# Patient Record
Sex: Female | Born: 1960 | Race: Black or African American | Hispanic: No | Marital: Married | Smoking: Never smoker
Health system: Southern US, Community
[De-identification: ages and names within clinical notes are randomized; demographics above are authoritative.]

## PROBLEM LIST (undated history)

## (undated) DIAGNOSIS — Z9221 Personal history of antineoplastic chemotherapy: Secondary | ICD-10-CM

## (undated) DIAGNOSIS — I10 Essential (primary) hypertension: Secondary | ICD-10-CM

## (undated) DIAGNOSIS — Z923 Personal history of irradiation: Secondary | ICD-10-CM

## (undated) DIAGNOSIS — C50919 Malignant neoplasm of unspecified site of unspecified female breast: Secondary | ICD-10-CM

## (undated) HISTORY — PX: TUBAL LIGATION: SHX77

## (undated) HISTORY — DX: Malignant neoplasm of unspecified site of unspecified female breast: C50.919

## (undated) HISTORY — DX: Essential (primary) hypertension: I10

---

## 1998-04-11 ENCOUNTER — Other Ambulatory Visit: Admission: RE | Admit: 1998-04-11 | Discharge: 1998-04-11 | Payer: Self-pay | Admitting: Obstetrics and Gynecology

## 1999-01-31 ENCOUNTER — Ambulatory Visit (HOSPITAL_COMMUNITY): Admission: RE | Admit: 1999-01-31 | Discharge: 1999-01-31 | Payer: Self-pay | Admitting: Obstetrics and Gynecology

## 1999-01-31 ENCOUNTER — Encounter: Payer: Self-pay | Admitting: Obstetrics and Gynecology

## 1999-03-27 ENCOUNTER — Other Ambulatory Visit: Admission: RE | Admit: 1999-03-27 | Discharge: 1999-03-27 | Payer: Self-pay | Admitting: *Deleted

## 1999-11-06 ENCOUNTER — Encounter: Admission: RE | Admit: 1999-11-06 | Discharge: 2000-02-04 | Payer: Self-pay | Admitting: Family Medicine

## 2000-02-20 ENCOUNTER — Ambulatory Visit (HOSPITAL_COMMUNITY): Admission: RE | Admit: 2000-02-20 | Discharge: 2000-02-20 | Payer: Self-pay | Admitting: Family Medicine

## 2000-02-20 ENCOUNTER — Encounter: Payer: Self-pay | Admitting: Family Medicine

## 2000-02-26 ENCOUNTER — Encounter: Admission: RE | Admit: 2000-02-26 | Discharge: 2000-02-26 | Payer: Self-pay | Admitting: Family Medicine

## 2000-02-26 ENCOUNTER — Encounter: Payer: Self-pay | Admitting: Family Medicine

## 2000-04-22 ENCOUNTER — Other Ambulatory Visit: Admission: RE | Admit: 2000-04-22 | Discharge: 2000-04-22 | Payer: Self-pay | Admitting: Obstetrics and Gynecology

## 2001-01-31 ENCOUNTER — Emergency Department (HOSPITAL_COMMUNITY): Admission: EM | Admit: 2001-01-31 | Discharge: 2001-01-31 | Payer: Self-pay

## 2001-01-31 ENCOUNTER — Encounter: Payer: Self-pay | Admitting: Emergency Medicine

## 2001-02-07 ENCOUNTER — Encounter: Payer: Self-pay | Admitting: Family Medicine

## 2001-02-07 ENCOUNTER — Encounter: Admission: RE | Admit: 2001-02-07 | Discharge: 2001-02-07 | Payer: Self-pay | Admitting: Family Medicine

## 2001-02-11 ENCOUNTER — Encounter: Admission: RE | Admit: 2001-02-11 | Discharge: 2001-03-09 | Payer: Self-pay | Admitting: Family Medicine

## 2001-02-27 ENCOUNTER — Encounter: Admission: RE | Admit: 2001-02-27 | Discharge: 2001-02-27 | Payer: Self-pay | Admitting: Obstetrics and Gynecology

## 2001-02-27 ENCOUNTER — Encounter: Payer: Self-pay | Admitting: Obstetrics and Gynecology

## 2001-05-25 ENCOUNTER — Other Ambulatory Visit: Admission: RE | Admit: 2001-05-25 | Discharge: 2001-05-25 | Payer: Self-pay | Admitting: Obstetrics and Gynecology

## 2002-03-15 ENCOUNTER — Encounter: Payer: Self-pay | Admitting: Obstetrics and Gynecology

## 2002-03-15 ENCOUNTER — Ambulatory Visit (HOSPITAL_COMMUNITY): Admission: RE | Admit: 2002-03-15 | Discharge: 2002-03-15 | Payer: Self-pay | Admitting: Obstetrics and Gynecology

## 2002-06-16 ENCOUNTER — Other Ambulatory Visit: Admission: RE | Admit: 2002-06-16 | Discharge: 2002-06-16 | Payer: Self-pay | Admitting: Obstetrics and Gynecology

## 2003-03-18 ENCOUNTER — Ambulatory Visit (HOSPITAL_COMMUNITY): Admission: RE | Admit: 2003-03-18 | Discharge: 2003-03-18 | Payer: Self-pay | Admitting: Obstetrics and Gynecology

## 2003-05-14 HISTORY — PX: BREAST LUMPECTOMY: SHX2

## 2003-06-27 ENCOUNTER — Other Ambulatory Visit: Admission: RE | Admit: 2003-06-27 | Discharge: 2003-06-27 | Payer: Self-pay | Admitting: Obstetrics and Gynecology

## 2004-01-23 ENCOUNTER — Encounter (INDEPENDENT_AMBULATORY_CARE_PROVIDER_SITE_OTHER): Payer: Self-pay | Admitting: Diagnostic Radiology

## 2004-01-23 ENCOUNTER — Encounter (INDEPENDENT_AMBULATORY_CARE_PROVIDER_SITE_OTHER): Payer: Self-pay | Admitting: *Deleted

## 2004-01-23 ENCOUNTER — Encounter: Admission: RE | Admit: 2004-01-23 | Discharge: 2004-01-23 | Payer: Self-pay | Admitting: Obstetrics and Gynecology

## 2004-01-24 ENCOUNTER — Encounter: Admission: RE | Admit: 2004-01-24 | Discharge: 2004-01-24 | Payer: Self-pay | Admitting: Obstetrics and Gynecology

## 2004-01-27 ENCOUNTER — Ambulatory Visit (HOSPITAL_COMMUNITY): Admission: RE | Admit: 2004-01-27 | Discharge: 2004-01-27 | Payer: Self-pay | Admitting: General Surgery

## 2004-02-08 ENCOUNTER — Ambulatory Visit (HOSPITAL_COMMUNITY): Admission: RE | Admit: 2004-02-08 | Discharge: 2004-02-09 | Payer: Self-pay | Admitting: General Surgery

## 2004-02-08 ENCOUNTER — Encounter (INDEPENDENT_AMBULATORY_CARE_PROVIDER_SITE_OTHER): Payer: Self-pay | Admitting: *Deleted

## 2004-02-08 ENCOUNTER — Encounter (INDEPENDENT_AMBULATORY_CARE_PROVIDER_SITE_OTHER): Payer: Self-pay | Admitting: General Surgery

## 2004-02-29 ENCOUNTER — Encounter (INDEPENDENT_AMBULATORY_CARE_PROVIDER_SITE_OTHER): Payer: Self-pay | Admitting: *Deleted

## 2004-02-29 ENCOUNTER — Ambulatory Visit (HOSPITAL_COMMUNITY): Admission: RE | Admit: 2004-02-29 | Discharge: 2004-02-29 | Payer: Self-pay | Admitting: General Surgery

## 2004-03-09 ENCOUNTER — Ambulatory Visit (HOSPITAL_COMMUNITY): Admission: RE | Admit: 2004-03-09 | Discharge: 2004-03-09 | Payer: Self-pay | Admitting: Oncology

## 2004-03-12 ENCOUNTER — Ambulatory Visit (HOSPITAL_COMMUNITY): Admission: RE | Admit: 2004-03-12 | Discharge: 2004-03-12 | Payer: Self-pay | Admitting: Oncology

## 2004-03-16 ENCOUNTER — Ambulatory Visit (HOSPITAL_COMMUNITY): Admission: RE | Admit: 2004-03-16 | Discharge: 2004-03-16 | Payer: Self-pay | Admitting: Oncology

## 2004-03-19 ENCOUNTER — Ambulatory Visit: Payer: Self-pay | Admitting: Oncology

## 2004-05-04 ENCOUNTER — Ambulatory Visit: Payer: Self-pay | Admitting: Oncology

## 2004-06-01 ENCOUNTER — Encounter: Admission: RE | Admit: 2004-06-01 | Discharge: 2004-06-01 | Payer: Self-pay | Admitting: Oncology

## 2004-06-25 ENCOUNTER — Ambulatory Visit: Payer: Self-pay | Admitting: Oncology

## 2004-07-16 ENCOUNTER — Ambulatory Visit: Admission: RE | Admit: 2004-07-16 | Discharge: 2004-09-28 | Payer: Self-pay | Admitting: Radiation Oncology

## 2004-07-23 ENCOUNTER — Encounter: Admission: RE | Admit: 2004-07-23 | Discharge: 2004-07-23 | Payer: Self-pay | Admitting: *Deleted

## 2004-08-10 ENCOUNTER — Ambulatory Visit: Payer: Self-pay | Admitting: Oncology

## 2004-10-23 ENCOUNTER — Ambulatory Visit: Payer: Self-pay | Admitting: Oncology

## 2004-11-02 ENCOUNTER — Ambulatory Visit (HOSPITAL_BASED_OUTPATIENT_CLINIC_OR_DEPARTMENT_OTHER): Admission: RE | Admit: 2004-11-02 | Discharge: 2004-11-02 | Payer: Self-pay | Admitting: General Surgery

## 2005-01-22 ENCOUNTER — Ambulatory Visit: Payer: Self-pay | Admitting: Oncology

## 2005-01-25 ENCOUNTER — Encounter: Admission: RE | Admit: 2005-01-25 | Discharge: 2005-01-25 | Payer: Self-pay | Admitting: General Surgery

## 2005-04-29 ENCOUNTER — Ambulatory Visit: Payer: Self-pay | Admitting: Oncology

## 2005-04-30 ENCOUNTER — Ambulatory Visit (HOSPITAL_COMMUNITY): Admission: RE | Admit: 2005-04-30 | Discharge: 2005-04-30 | Payer: Self-pay | Admitting: Oncology

## 2005-09-08 ENCOUNTER — Ambulatory Visit: Payer: Self-pay | Admitting: Oncology

## 2005-09-10 LAB — COMPREHENSIVE METABOLIC PANEL
BUN: 16 mg/dL (ref 6–23)
CO2: 27 mEq/L (ref 19–32)
Creatinine, Ser: 1.2 mg/dL (ref 0.4–1.2)
Glucose, Bld: 83 mg/dL (ref 70–99)
Total Bilirubin: 0.4 mg/dL (ref 0.3–1.2)

## 2005-09-10 LAB — CBC WITH DIFFERENTIAL/PLATELET
Basophils Absolute: 0 10*3/uL (ref 0.0–0.1)
Eosinophils Absolute: 0 10*3/uL (ref 0.0–0.5)
LYMPH%: 41.1 % (ref 14.0–48.0)
MCV: 93.7 fL (ref 81.0–101.0)
MONO%: 8.1 % (ref 0.0–13.0)
NEUT#: 2 10*3/uL (ref 1.5–6.5)
Platelets: 276 10*3/uL (ref 145–400)
RBC: 4.06 10*6/uL (ref 3.70–5.32)

## 2005-09-10 LAB — LACTATE DEHYDROGENASE: LDH: 170 U/L (ref 94–250)

## 2005-09-10 LAB — CANCER ANTIGEN 27.29: CA 27.29: 11 U/mL (ref 0–39)

## 2005-09-16 ENCOUNTER — Ambulatory Visit (HOSPITAL_COMMUNITY): Admission: RE | Admit: 2005-09-16 | Discharge: 2005-09-16 | Payer: Self-pay | Admitting: Oncology

## 2005-09-20 ENCOUNTER — Ambulatory Visit (HOSPITAL_COMMUNITY): Admission: RE | Admit: 2005-09-20 | Discharge: 2005-09-20 | Payer: Self-pay | Admitting: Oncology

## 2006-01-23 ENCOUNTER — Ambulatory Visit: Payer: Self-pay | Admitting: Oncology

## 2006-01-27 ENCOUNTER — Encounter: Admission: RE | Admit: 2006-01-27 | Discharge: 2006-01-27 | Payer: Self-pay | Admitting: Oncology

## 2006-01-27 ENCOUNTER — Ambulatory Visit (HOSPITAL_COMMUNITY): Admission: RE | Admit: 2006-01-27 | Discharge: 2006-01-27 | Payer: Self-pay | Admitting: Oncology

## 2006-01-27 LAB — CBC WITH DIFFERENTIAL/PLATELET
EOS%: 0.8 % (ref 0.0–7.0)
LYMPH%: 34.3 % (ref 14.0–48.0)
MCH: 32.2 pg (ref 26.0–34.0)
MCHC: 34.1 g/dL (ref 32.0–36.0)
MCV: 94.4 fL (ref 81.0–101.0)
MONO%: 9.7 % (ref 0.0–13.0)
Platelets: 286 10*3/uL (ref 145–400)
RBC: 4.18 10*6/uL (ref 3.70–5.32)
RDW: 12.4 % (ref 11.3–14.5)

## 2006-01-27 LAB — COMPREHENSIVE METABOLIC PANEL
ALT: 18 U/L (ref 0–40)
Alkaline Phosphatase: 87 U/L (ref 39–117)
Creatinine, Ser: 1.1 mg/dL (ref 0.40–1.20)
Sodium: 141 mEq/L (ref 135–145)
Total Bilirubin: 0.4 mg/dL (ref 0.3–1.2)
Total Protein: 7.9 g/dL (ref 6.0–8.3)

## 2006-01-27 LAB — TECHNOLOGIST REVIEW: Technologist Review: 11

## 2006-04-23 ENCOUNTER — Ambulatory Visit: Payer: Self-pay | Admitting: Oncology

## 2006-04-25 LAB — COMPREHENSIVE METABOLIC PANEL
Albumin: 4.1 g/dL (ref 3.5–5.2)
CO2: 25 mEq/L (ref 19–32)
Calcium: 9.4 mg/dL (ref 8.4–10.5)
Chloride: 102 mEq/L (ref 96–112)
Glucose, Bld: 88 mg/dL (ref 70–99)
Potassium: 3.5 mEq/L (ref 3.5–5.3)
Sodium: 138 mEq/L (ref 135–145)
Total Bilirubin: 0.4 mg/dL (ref 0.3–1.2)
Total Protein: 7.3 g/dL (ref 6.0–8.3)

## 2006-04-25 LAB — CBC WITH DIFFERENTIAL/PLATELET
Eosinophils Absolute: 0 10*3/uL (ref 0.0–0.5)
LYMPH%: 45 % (ref 14.0–48.0)
MONO#: 0.3 10*3/uL (ref 0.1–0.9)
NEUT#: 1.8 10*3/uL (ref 1.5–6.5)
Platelets: 255 10*3/uL (ref 145–400)
RBC: 3.99 10*6/uL (ref 3.70–5.32)
WBC: 3.9 10*3/uL (ref 3.9–10.0)
lymph#: 1.8 10*3/uL (ref 0.9–3.3)

## 2006-04-25 LAB — LACTATE DEHYDROGENASE: LDH: 171 U/L (ref 94–250)

## 2006-04-29 ENCOUNTER — Ambulatory Visit (HOSPITAL_COMMUNITY): Admission: RE | Admit: 2006-04-29 | Discharge: 2006-04-29 | Payer: Self-pay | Admitting: Oncology

## 2006-08-11 ENCOUNTER — Encounter: Admission: RE | Admit: 2006-08-11 | Discharge: 2006-08-11 | Payer: Self-pay | Admitting: Family Medicine

## 2006-08-19 ENCOUNTER — Ambulatory Visit: Payer: Self-pay | Admitting: Oncology

## 2006-08-22 LAB — COMPREHENSIVE METABOLIC PANEL
ALT: 8 U/L (ref 0–35)
Albumin: 3.9 g/dL (ref 3.5–5.2)
Alkaline Phosphatase: 75 U/L (ref 39–117)
CO2: 26 mEq/L (ref 19–32)
Glucose, Bld: 84 mg/dL (ref 70–99)
Potassium: 3.6 mEq/L (ref 3.5–5.3)
Sodium: 138 mEq/L (ref 135–145)
Total Bilirubin: 0.4 mg/dL (ref 0.3–1.2)
Total Protein: 7.6 g/dL (ref 6.0–8.3)

## 2006-08-22 LAB — CBC WITH DIFFERENTIAL/PLATELET
BASO%: 0.5 % (ref 0.0–2.0)
Eosinophils Absolute: 0 10*3/uL (ref 0.0–0.5)
LYMPH%: 41.7 % (ref 14.0–48.0)
MCHC: 35 g/dL (ref 32.0–36.0)
MONO#: 0.3 10*3/uL (ref 0.1–0.9)
MONO%: 7 % (ref 0.0–13.0)
NEUT#: 2.2 10*3/uL (ref 1.5–6.5)
Platelets: 266 10*3/uL (ref 145–400)
RBC: 3.93 10*6/uL (ref 3.70–5.32)
RDW: 12.7 % (ref 11.3–14.5)
WBC: 4.4 10*3/uL (ref 3.9–10.0)

## 2006-12-17 ENCOUNTER — Ambulatory Visit: Payer: Self-pay | Admitting: Oncology

## 2006-12-19 LAB — CBC WITH DIFFERENTIAL/PLATELET
BASO%: 0.5 % (ref 0.0–2.0)
Basophils Absolute: 0 10*3/uL (ref 0.0–0.1)
EOS%: 0.7 % (ref 0.0–7.0)
HCT: 35.8 % (ref 34.8–46.6)
HGB: 12.7 g/dL (ref 11.6–15.9)
LYMPH%: 38.3 % (ref 14.0–48.0)
MCH: 32.8 pg (ref 26.0–34.0)
MCHC: 35.4 g/dL (ref 32.0–36.0)
MCV: 92.7 fL (ref 81.0–101.0)
MONO%: 5.5 % (ref 0.0–13.0)
NEUT%: 55 % (ref 39.6–76.8)

## 2006-12-19 LAB — COMPREHENSIVE METABOLIC PANEL
ALT: 14 U/L (ref 0–35)
AST: 19 U/L (ref 0–37)
Alkaline Phosphatase: 67 U/L (ref 39–117)
Calcium: 9.3 mg/dL (ref 8.4–10.5)
Chloride: 105 mEq/L (ref 96–112)
Creatinine, Ser: 1.2 mg/dL (ref 0.40–1.20)
Total Bilirubin: 0.5 mg/dL (ref 0.3–1.2)

## 2007-02-04 ENCOUNTER — Encounter: Admission: RE | Admit: 2007-02-04 | Discharge: 2007-02-04 | Payer: Self-pay | Admitting: Oncology

## 2007-05-25 ENCOUNTER — Ambulatory Visit (HOSPITAL_COMMUNITY): Admission: RE | Admit: 2007-05-25 | Discharge: 2007-05-25 | Payer: Self-pay | Admitting: Oncology

## 2007-06-17 ENCOUNTER — Ambulatory Visit: Payer: Self-pay | Admitting: Oncology

## 2007-06-19 LAB — CBC WITH DIFFERENTIAL/PLATELET
Basophils Absolute: 0 10*3/uL (ref 0.0–0.1)
EOS%: 0.5 % (ref 0.0–7.0)
HCT: 36.1 % (ref 34.8–46.6)
HGB: 12.5 g/dL (ref 11.6–15.9)
MCH: 32.2 pg (ref 26.0–34.0)
MCV: 92.9 fL (ref 81.0–101.0)
MONO%: 7.1 % (ref 0.0–13.0)
NEUT%: 52.4 % (ref 39.6–76.8)
Platelets: 266 10*3/uL (ref 145–400)

## 2007-06-20 LAB — COMPREHENSIVE METABOLIC PANEL
AST: 19 U/L (ref 0–37)
Alkaline Phosphatase: 68 U/L (ref 39–117)
BUN: 22 mg/dL (ref 6–23)
Calcium: 9.2 mg/dL (ref 8.4–10.5)
Chloride: 103 mEq/L (ref 96–112)
Creatinine, Ser: 1.23 mg/dL — ABNORMAL HIGH (ref 0.40–1.20)

## 2007-09-20 IMAGING — CT CT PELVIS W/ CM
2 of 4 series · 7 of 32 positions shown, 11 images · IV contrast (omnipaque)
Comparison: none

CLINICAL DATA: Breast cancer. 
CHEST CT WITH CONTRAST:
TECHNIQUE: Multidetector CT imaging of the chest was performed following the standard protocol during bolus administration of intravenous contrast.
Contrast:  124 cc Omnipaque 300
TECHNIQUE: Multidetector CT imaging of the abdomen was performed following the standard protocol during bolus administration of intravenous contrast.
The liver, kidney, adrenal glands, pancreas, and spleen are within normal limits.  Negative free fluid. Borderline right lower quadrant mesenteric and retroperitoneal adenopathy is present.  There is a 9 mm right lower quadrant mesenteric node on image 81.  9 mm short-axis diameter node in the cecal mesentery on image 81.  A right iliac node 10 mm in short-axis diameter on image 76.  No suspicious bone lesions are identified.  Degenerative changes in the lumbar facets are noted.
TECHNIQUE: Multidetector CT imaging of the pelvis was performed following the standard protocol during bolus administration of intravenous contrast.

[Series 2: cap 5.0 b40f · axial · 0.63mm/px · z∈[-607,-27]mm · 3 of 117 slices shown, 7 images]
[im 1/117  soft-tissue]
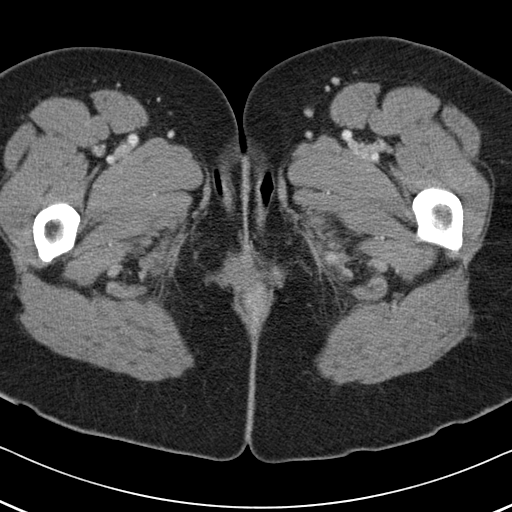
[im 1/117  lung]
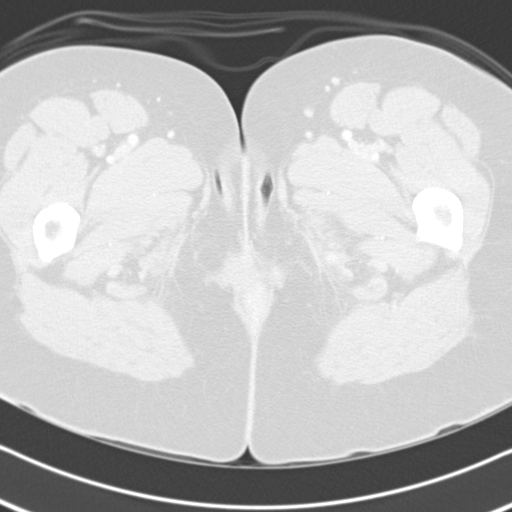
[im 1/117  bone]
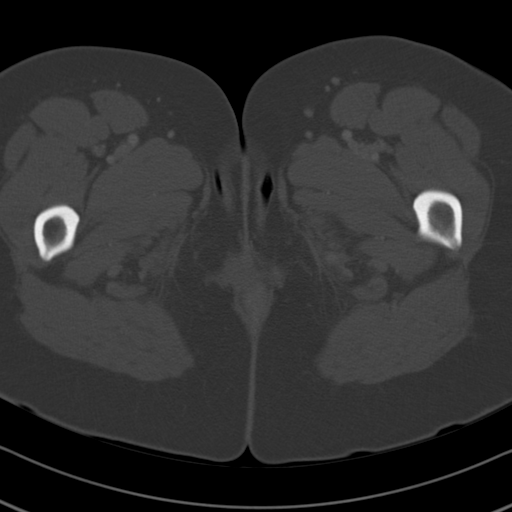
[im 59/117  soft-tissue]
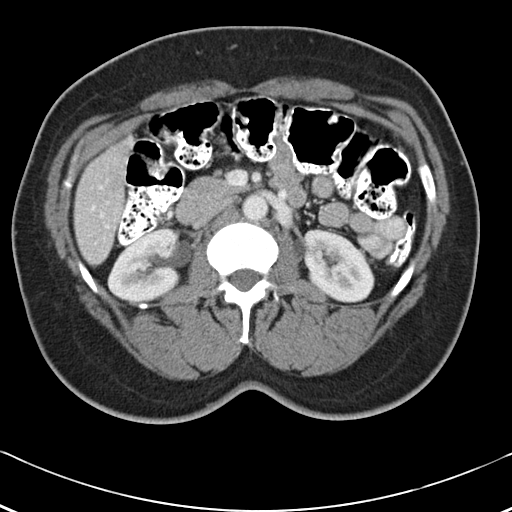
[im 59/117  lung]
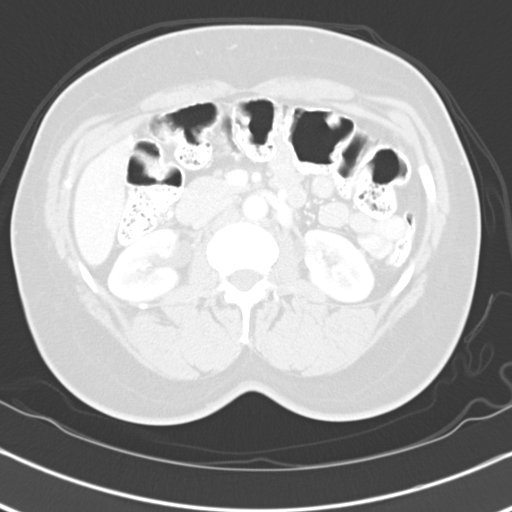
[im 117/117  soft-tissue]
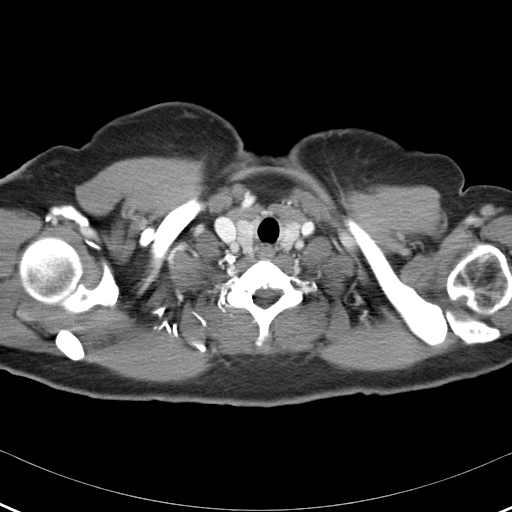
[im 117/117  lung]
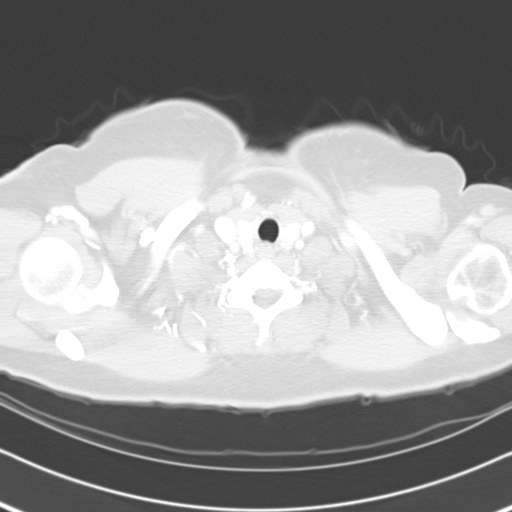

[Series 7: cap 1.5 b60f · axial · 0.63mm/px · z∈[-218,-75]mm · 4 of 239 slices shown]
[im 48/239  soft-tissue]
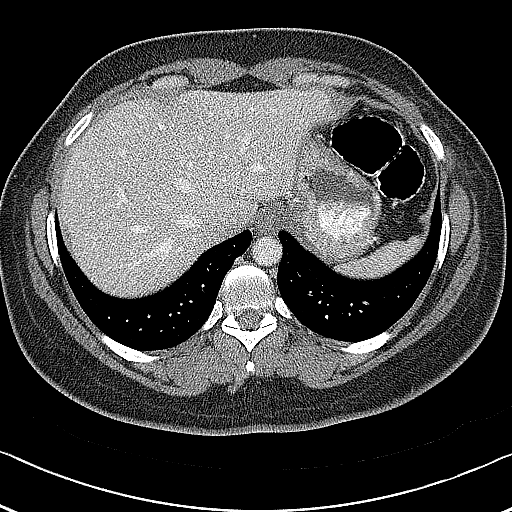
[im 96/239  soft-tissue]
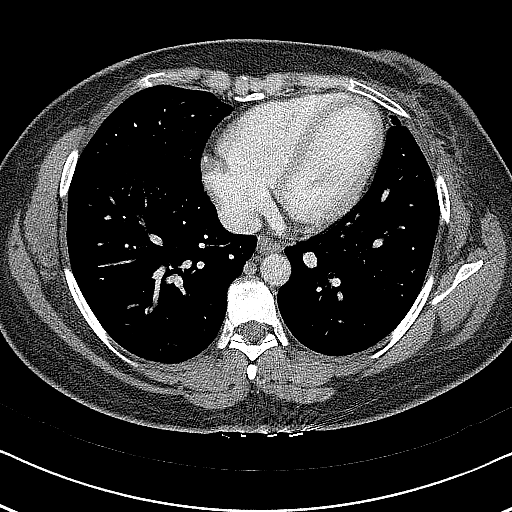
[im 143/239  soft-tissue]
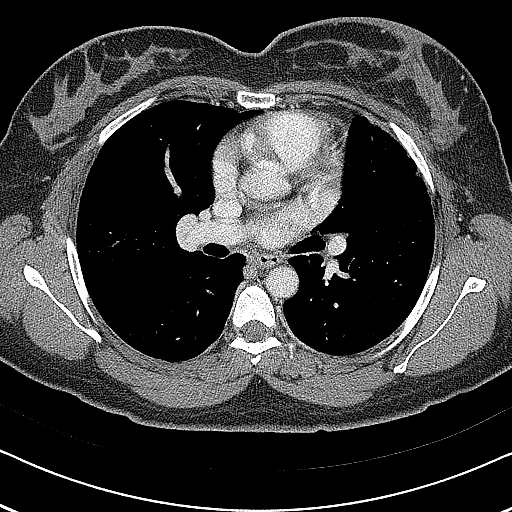
[im 191/239  soft-tissue]
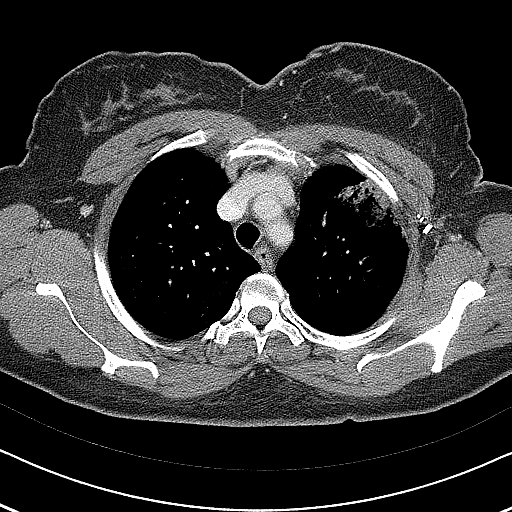

[7 of 32 positions shown; findings below may reference images not displayed]

FINDINGS: Skin thickening overlying the left breast and post-operative changes are stable.  Negative abnormal mediastinal, axillary, or hilar adenopathy.  Negative pericardial effusion.  Post-radiation changes in the anterior left upper lobe are stable.  On image 71 series 7, a 3 mm right upper lobe nodule is stable in retrospect.  A tiny 3 mm right lower lobe nodule on image 107 series 7 is probably also unchanged.  A 2 to 3 mm right lower lobe nodule on image 135 is stable.  A left lower lobe nodule on image 123 is stable approximately 3 to 4 mm.  
No pneumothoraces or effusions are seen.
IMPRESSION: 1.  Stable subcentimeter pulmonary nodules. 
2.  Stable post-surgical and post radiation changes on the left as described.
ABDOMEN CT WITH CONTRAST:
IMPRESSION: Borderline right lower quadrant mesenteric and retroperitoneal adenopathy.  tastatic disease cannot be excluded.  Short term followup in 3 months may be helpful. 
PELVIS CT WITH CONTRAST:
FINDINGS: A small amount of free fluid is present likely physiologic.  The uterus and adnexa are within normal limits.  Borderline right iliac adenopathy is present as described in the abdomen CT.
IMPRESSION: Borderline right iliac adenopathy as described in the abdomen CT.

## 2008-02-11 ENCOUNTER — Ambulatory Visit: Payer: Self-pay | Admitting: Oncology

## 2008-02-15 LAB — CBC WITH DIFFERENTIAL/PLATELET
BASO%: 0.6 % (ref 0.0–2.0)
Basophils Absolute: 0 10*3/uL (ref 0.0–0.1)
EOS%: 0.5 % (ref 0.0–7.0)
MCH: 32.7 pg (ref 26.0–34.0)
MCHC: 34.5 g/dL (ref 32.0–36.0)
MCV: 95 fL (ref 81.0–101.0)
MONO%: 6.7 % (ref 0.0–13.0)
RBC: 3.94 10*6/uL (ref 3.70–5.32)
RDW: 12.7 % (ref 11.3–14.5)

## 2008-02-16 LAB — COMPREHENSIVE METABOLIC PANEL
AST: 18 U/L (ref 0–37)
Albumin: 4 g/dL (ref 3.5–5.2)
Alkaline Phosphatase: 65 U/L (ref 39–117)
BUN: 13 mg/dL (ref 6–23)
Potassium: 4.2 mEq/L (ref 3.5–5.3)
Sodium: 138 mEq/L (ref 135–145)

## 2008-02-16 LAB — VITAMIN D 25 HYDROXY (VIT D DEFICIENCY, FRACTURES): Vit D, 25-Hydroxy: 21 ng/mL — ABNORMAL LOW (ref 30–89)

## 2008-02-22 ENCOUNTER — Encounter: Admission: RE | Admit: 2008-02-22 | Discharge: 2008-02-22 | Payer: Self-pay | Admitting: Unknown Physician Specialty

## 2008-05-19 ENCOUNTER — Ambulatory Visit: Payer: Self-pay | Admitting: Obstetrics and Gynecology

## 2008-08-24 ENCOUNTER — Ambulatory Visit: Payer: Self-pay | Admitting: Oncology

## 2008-08-26 LAB — CBC WITH DIFFERENTIAL/PLATELET
Eosinophils Absolute: 0 10*3/uL (ref 0.0–0.5)
MONO#: 0.2 10*3/uL (ref 0.1–0.9)
NEUT#: 1.5 10*3/uL (ref 1.5–6.5)
RBC: 4.16 10*6/uL (ref 3.70–5.45)
RDW: 12 % (ref 11.2–14.5)
WBC: 3.7 10*3/uL — ABNORMAL LOW (ref 3.9–10.3)
lymph#: 1.9 10*3/uL (ref 0.9–3.3)

## 2008-08-29 LAB — COMPREHENSIVE METABOLIC PANEL
Albumin: 3.7 g/dL (ref 3.5–5.2)
Alkaline Phosphatase: 64 U/L (ref 39–117)
CO2: 28 mEq/L (ref 19–32)
Glucose, Bld: 88 mg/dL (ref 70–99)
Potassium: 4.2 mEq/L (ref 3.5–5.3)
Sodium: 139 mEq/L (ref 135–145)
Total Protein: 7.3 g/dL (ref 6.0–8.3)

## 2008-08-29 LAB — CANCER ANTIGEN 27.29: CA 27.29: 23 U/mL (ref 0–39)

## 2008-08-29 LAB — LACTATE DEHYDROGENASE: LDH: 152 U/L (ref 94–250)

## 2009-02-28 ENCOUNTER — Ambulatory Visit: Payer: Self-pay | Admitting: Oncology

## 2009-03-02 LAB — CBC WITH DIFFERENTIAL/PLATELET
Basophils Absolute: 0 10*3/uL (ref 0.0–0.1)
EOS%: 0.8 % (ref 0.0–7.0)
HCT: 35.8 % (ref 34.8–46.6)
HGB: 12.3 g/dL (ref 11.6–15.9)
MCH: 32.3 pg (ref 25.1–34.0)
MCV: 94.1 fL (ref 79.5–101.0)
MONO%: 7.1 % (ref 0.0–14.0)
NEUT%: 48.8 % (ref 38.4–76.8)

## 2009-03-03 LAB — COMPREHENSIVE METABOLIC PANEL
AST: 20 U/L (ref 0–37)
Alkaline Phosphatase: 60 U/L (ref 39–117)
BUN: 19 mg/dL (ref 6–23)
Calcium: 9.1 mg/dL (ref 8.4–10.5)
Chloride: 104 mEq/L (ref 96–112)
Creatinine, Ser: 1.1 mg/dL (ref 0.40–1.20)
Glucose, Bld: 88 mg/dL (ref 70–99)

## 2009-03-03 LAB — CANCER ANTIGEN 27.29: CA 27.29: 22 U/mL (ref 0–39)

## 2009-03-17 ENCOUNTER — Ambulatory Visit: Payer: Self-pay | Admitting: Genetic Counselor

## 2009-09-11 ENCOUNTER — Ambulatory Visit: Payer: Self-pay | Admitting: Genetic Counselor

## 2009-09-12 ENCOUNTER — Ambulatory Visit: Payer: Self-pay | Admitting: Oncology

## 2009-09-12 LAB — CBC WITH DIFFERENTIAL/PLATELET
BASO%: 0.4 % (ref 0.0–2.0)
EOS%: 0.8 % (ref 0.0–7.0)
Eosinophils Absolute: 0 10*3/uL (ref 0.0–0.5)
HCT: 38.3 % (ref 34.8–46.6)
MCHC: 34.1 g/dL (ref 31.5–36.0)
MONO%: 6.3 % (ref 0.0–14.0)
NEUT#: 2.1 10*3/uL (ref 1.5–6.5)
NEUT%: 45 % (ref 38.4–76.8)
Platelets: 255 10*3/uL (ref 145–400)
RBC: 4.01 10*6/uL (ref 3.70–5.45)
lymph#: 2.2 10*3/uL (ref 0.9–3.3)

## 2009-09-12 LAB — COMPREHENSIVE METABOLIC PANEL
BUN: 16 mg/dL (ref 6–23)
Calcium: 9.6 mg/dL (ref 8.4–10.5)
Glucose, Bld: 89 mg/dL (ref 70–99)
Potassium: 3.9 mEq/L (ref 3.5–5.3)
Sodium: 138 mEq/L (ref 135–145)
Total Bilirubin: 0.2 mg/dL — ABNORMAL LOW (ref 0.3–1.2)
Total Protein: 7.7 g/dL (ref 6.0–8.3)

## 2009-09-12 LAB — CANCER ANTIGEN 27.29: CA 27.29: 14 U/mL (ref 0–39)

## 2009-09-12 LAB — LACTATE DEHYDROGENASE: LDH: 185 U/L (ref 94–250)

## 2009-09-12 LAB — VITAMIN D 25 HYDROXY (VIT D DEFICIENCY, FRACTURES): Vit D, 25-Hydroxy: 32 ng/mL (ref 30–89)

## 2010-03-16 ENCOUNTER — Ambulatory Visit: Payer: Self-pay | Admitting: Oncology

## 2010-03-20 LAB — CBC WITH DIFFERENTIAL/PLATELET
BASO%: 0.3 % (ref 0.0–2.0)
HGB: 12.8 g/dL (ref 11.6–15.9)
MCHC: 33.9 g/dL (ref 31.5–36.0)
MONO#: 0.2 10*3/uL (ref 0.1–0.9)
MONO%: 5.4 % (ref 0.0–14.0)
NEUT#: 3 10*3/uL (ref 1.5–6.5)
NEUT%: 65.5 % (ref 38.4–76.8)
RBC: 3.96 10*6/uL (ref 3.70–5.45)
RDW: 12.7 % (ref 11.2–14.5)
lymph#: 1.3 10*3/uL (ref 0.9–3.3)

## 2010-03-21 LAB — COMPREHENSIVE METABOLIC PANEL
AST: 16 U/L (ref 0–37)
Alkaline Phosphatase: 69 U/L (ref 39–117)
Creatinine, Ser: 1.14 mg/dL (ref 0.40–1.20)
Potassium: 3.8 mEq/L (ref 3.5–5.3)
Sodium: 138 mEq/L (ref 135–145)
Total Protein: 7.3 g/dL (ref 6.0–8.3)

## 2010-03-21 LAB — VITAMIN D 25 HYDROXY (VIT D DEFICIENCY, FRACTURES): Vit D, 25-Hydroxy: 28 ng/mL — ABNORMAL LOW (ref 30–89)

## 2010-03-21 LAB — CANCER ANTIGEN 27.29: CA 27.29: 15 U/mL (ref 0–39)

## 2010-06-02 ENCOUNTER — Encounter: Payer: Self-pay | Admitting: Oncology

## 2010-06-03 ENCOUNTER — Encounter: Payer: Self-pay | Admitting: Oncology

## 2010-06-04 ENCOUNTER — Encounter: Payer: Self-pay | Admitting: Oncology

## 2010-09-28 NOTE — Op Note (Signed)
Tiffany Perkins, Tiffany Perkins             ACCOUNT NO.:  [id number]   MEDICAL RECORD NO.:  [id number]          PATIENT TYPE:  OIB   LOCATION:  2899                         FACILITY:  [facility]   PHYSICIAN:  Rose Phi. Maple Hudson, M.D.   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   OPERATION:  1.  Re-excision of left lumpectomy site for margin.  2.  Insertion of Port-A-Cath.   SURGEON:  Rose Phi. Maple Hudson, M.D.   ANESTHESIA:  General.   OPERATIVE PROCEDURE:  After suitable general anesthesia was induced, the  patient was placed in the supine position with the arms extended on the arm  board and the left breast prepped and draped in the usual fashion.  The old  curved incision in the inferior portion of her left breast was then incised  and we exposed the seroma cavity, and then excised the inferior margin from  3 o'clock through 6 o'clock and to 9 o'clock to give a clean margin.  Hemostasis was obtained with the cautery.  The incision was then closed in 2  layers with 3-0 Vicryl and subcuticular 4-0 Monocryl, and Steri-Strips.   After a dressing was applied, we then changed the position for Port-A-Cath  insertion with arms by the sides and a roll between the shoulders.  We then  prepped and draped the right upper chest and neck.   A right subclavian puncture was then carried out without difficulty and the  wire was inserted, and then proper positioning of the wire confirmed by  fluoroscopy.   I then made an incision on the anterior chest wall and developed a pocket  for the implantable Bard X-Port.  I tunneled between the subclavian puncture  site and the newly developed pocket area and passed a catheter through that  and then attached it to the port, and then put it in position in  the pocket.  We trimmed the catheter to go to the 4th interspace, which would be at the  cavoatrial junction.   I then passed the dilator and peel-away sheath over the wire and removed the  wire and the dilator and passed the catheter through the peel-away sheath,  and then removed it.   Again, fluoroscopy was used to confirm proper position of the catheter tip  in the superior vena cava and that there was no kinking in the system.   I then anchored the port in the pocket with two 2-0 Prolene sutures.  I  accessed it and flushed it and aspirated, which it did both easily, and then  fully heparinized it with the heparin concentrate.   Incisions were closed with 3-0 Vicryl and subcuticular 4-0 Monocryl, and  Steri-Strips.   Dressing was then applied and the patient transferred to the recovery room  in satisfactory condition, having tolerated the procedure well.      Cindee Lame   PRY/MEDQ  D:  02/29/2004  T:  02/29/2004  Job:  [id number]

## 2010-09-28 NOTE — Op Note (Signed)
NAMEILA, LANDOWSKI             ACCOUNT NO.:  [id number]   MEDICAL RECORD NO.:  [id number]          PATIENT TYPE:  OIB   LOCATION:  2866                         FACILITY:  [facility]   PHYSICIAN:  Rose Phi. Maple Hudson, M.D.   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   OPERATION:  1.  Left partial mastectomy.  2.  Left axillary lymph node dissection.   SURGEON:  Rose Phi. Maple Hudson, M.D.   ANESTHESIA:  General.   OPERATIVE PROCEDURE:  After a suitable general endotracheal anesthesia was  induced, the patient was placed in the supine position with the arm extended  on the arm board.  The left breast and axilla were then prepped and draped  in the usual fashion.  The palpable mass was at about the 4:30 position of  the left breast, and a curved incision overlying it incorporating an ellipse  of skin was then outlined and the area incised, and a wide excision of the  tumor was carried out.  The specimen was then oriented for the pathologist  for margin evaluation and tissue was then sent to them for margin  evaluation.   While that was being done, a transverse left axillary incision was made with  dissection through the subcutaneous tissue to the clavipectoral fascia.  We  incised the clavipectoral fascia and exposed the pectoralis major muscle.  We then dissected along the major, retracting it, and exposed the minor and  retracted that and then divided the clavipectoral fascia at the level of the  axillary vein.  I then dissected all the tissue from inferior to the  axillary vein and deep to the pectoralis minor muscle out, incorporating the  level 1 and level 2 lymph nodes.  The long thoracic and thoracodorsal nerves  were identified and preserved.  Other cutaneous nerves  and vessels were  clipped and divided.   Following the removal of the axillary contents, we then thoroughly irrigated  the field with saline.  We had good hemostasis.  A 19 Blake drain was  inserted and brought out through a separate stab wound.  The subcutaneous  tissue was then closed with 3-0 Vicryl and the skin with a subcuticular 4-0  Monocryl and Steri-Strips.   The pathologist's report was that our margins were clean on the lumpectomy,  so that incision was also closed with 3-0 Vicryl and subcuticular 4-0  Monocryl and Steri-Strips.   Dressings were then applied and the patient transferred to the recovery room  in satisfactory condition, having tolerated the procedure well.      Pete   PRY/MEDQ  D:  02/08/2004  T:  02/08/2004  Job:  [id number]

## 2010-09-28 NOTE — Op Note (Signed)
Tiffany Perkins, Tiffany Perkins             ACCOUNT NO.:  [id number]   MEDICAL RECORD NO.:  [id number]          PATIENT TYPE:  AMB   LOCATION:  DSC                          FACILITY:  [facility]   PHYSICIAN:  Rose Phi. Maple Hudson, M.D.   DATE OF BIRTH:  July 10, 1960   DATE OF PROCEDURE:  11/02/2004  DATE OF DISCHARGE:                                 OPERATIVE REPORT   PREOPERATIVE DIAGNOSIS:  Carcinoma of the breast.   POSTOPERATIVE DIAGNOSIS:  Carcinoma of the breast.   OPERATION:  Removal of Port-A-Cath.   SURGEON:  Rose Phi. Maple Hudson, M.D.   ANESTHESIA:  Local.   OPERATIVE PROCEDURE:  The patient was placed on the operating table and the  right upper chest prepped and draped in the usual fashion.  After  anesthetizing the old port site and incision, I made a transverse incision,  exposed the catheter and then removed it from the subclavian vein.   With traction on the catheter, then we divided the 2 sutures holding it in  place and slipped the port out.  There was no bleeding.   Incision was closed with subcuticular 4-0 Monocryl and Steri-Strips.  Dressing applied.  Patient transferred to the recovery room and then allowed  to go home.       PRY/MEDQ  D:  11/02/2004  T:  11/03/2004  Job:  [id number]

## 2010-10-12 ENCOUNTER — Other Ambulatory Visit: Payer: Self-pay | Admitting: Oncology

## 2010-10-12 ENCOUNTER — Encounter (HOSPITAL_BASED_OUTPATIENT_CLINIC_OR_DEPARTMENT_OTHER): Payer: PRIVATE HEALTH INSURANCE | Admitting: Oncology

## 2010-10-12 DIAGNOSIS — Z171 Estrogen receptor negative status [ER-]: Secondary | ICD-10-CM

## 2010-10-12 DIAGNOSIS — Z09 Encounter for follow-up examination after completed treatment for conditions other than malignant neoplasm: Secondary | ICD-10-CM

## 2010-10-12 DIAGNOSIS — Z853 Personal history of malignant neoplasm of breast: Secondary | ICD-10-CM

## 2010-10-12 LAB — COMPREHENSIVE METABOLIC PANEL
ALT: 16 U/L (ref 0–35)
AST: 20 U/L (ref 0–37)
Albumin: 4 g/dL (ref 3.5–5.2)
BUN: 18 mg/dL (ref 6–23)
Calcium: 9.4 mg/dL (ref 8.4–10.5)
Chloride: 105 mEq/L (ref 96–112)
Creatinine, Ser: 1.23 mg/dL — ABNORMAL HIGH (ref 0.50–1.10)
Potassium: 4.2 mEq/L (ref 3.5–5.3)
Total Bilirubin: 0.3 mg/dL (ref 0.3–1.2)
Total Protein: 7.6 g/dL (ref 6.0–8.3)

## 2010-10-12 LAB — CBC WITH DIFFERENTIAL/PLATELET
MCH: 32.2 pg (ref 25.1–34.0)
MCHC: 34.4 g/dL (ref 31.5–36.0)
MCV: 93.7 fL (ref 79.5–101.0)
Platelets: 247 10*3/uL (ref 145–400)
WBC: 3.6 10*3/uL — ABNORMAL LOW (ref 3.9–10.3)
lymph#: 1.4 10*3/uL (ref 0.9–3.3)

## 2010-10-12 LAB — CANCER ANTIGEN 27.29: CA 27.29: 14 U/mL (ref 0–39)

## 2010-10-18 ENCOUNTER — Other Ambulatory Visit: Payer: Self-pay | Admitting: Oncology

## 2010-10-18 ENCOUNTER — Encounter (HOSPITAL_BASED_OUTPATIENT_CLINIC_OR_DEPARTMENT_OTHER): Payer: PRIVATE HEALTH INSURANCE | Admitting: Oncology

## 2010-10-18 DIAGNOSIS — Z9889 Other specified postprocedural states: Secondary | ICD-10-CM

## 2010-10-18 DIAGNOSIS — Z853 Personal history of malignant neoplasm of breast: Secondary | ICD-10-CM

## 2010-10-18 DIAGNOSIS — Z171 Estrogen receptor negative status [ER-]: Secondary | ICD-10-CM

## 2010-10-18 DIAGNOSIS — C50919 Malignant neoplasm of unspecified site of unspecified female breast: Secondary | ICD-10-CM

## 2011-10-18 ENCOUNTER — Other Ambulatory Visit: Payer: PRIVATE HEALTH INSURANCE | Admitting: Lab

## 2011-10-18 ENCOUNTER — Ambulatory Visit: Payer: PRIVATE HEALTH INSURANCE | Admitting: Oncology

## 2011-10-21 ENCOUNTER — Telehealth: Payer: Self-pay | Admitting: Oncology

## 2011-10-21 NOTE — Telephone Encounter (Signed)
S/w the pt and we r/s her June appts that she missed

## 2011-11-08 ENCOUNTER — Other Ambulatory Visit (HOSPITAL_BASED_OUTPATIENT_CLINIC_OR_DEPARTMENT_OTHER): Payer: Self-pay | Admitting: Lab

## 2011-11-08 DIAGNOSIS — Z853 Personal history of malignant neoplasm of breast: Secondary | ICD-10-CM

## 2011-11-08 LAB — CBC WITH DIFFERENTIAL/PLATELET
Basophils Absolute: 0 10*3/uL (ref 0.0–0.1)
Eosinophils Absolute: 0 10*3/uL (ref 0.0–0.5)
HCT: 37.6 % (ref 34.8–46.6)
HGB: 12.6 g/dL (ref 11.6–15.9)
LYMPH%: 45.9 % (ref 14.0–49.7)
MCV: 94.5 fL (ref 79.5–101.0)
MONO%: 6.4 % (ref 0.0–14.0)
NEUT#: 1.8 10*3/uL (ref 1.5–6.5)
NEUT%: 46.5 % (ref 38.4–76.8)
Platelets: 244 10*3/uL (ref 145–400)

## 2011-11-09 LAB — COMPREHENSIVE METABOLIC PANEL
CO2: 25 mEq/L (ref 19–32)
Calcium: 9.3 mg/dL (ref 8.4–10.5)
Chloride: 104 mEq/L (ref 96–112)
Creatinine, Ser: 1.25 mg/dL — ABNORMAL HIGH (ref 0.50–1.10)
Glucose, Bld: 80 mg/dL (ref 70–99)
Total Bilirubin: 0.3 mg/dL (ref 0.3–1.2)

## 2011-11-09 LAB — VITAMIN D 25 HYDROXY (VIT D DEFICIENCY, FRACTURES): Vit D, 25-Hydroxy: 41 ng/mL (ref 30–89)

## 2011-11-21 ENCOUNTER — Telehealth: Payer: Self-pay | Admitting: Oncology

## 2011-11-21 ENCOUNTER — Ambulatory Visit (HOSPITAL_BASED_OUTPATIENT_CLINIC_OR_DEPARTMENT_OTHER): Payer: Self-pay | Admitting: Oncology

## 2011-11-21 VITALS — BP 151/85 | HR 92 | Temp 98.1°F | Wt 178.1 lb

## 2011-11-21 DIAGNOSIS — C50919 Malignant neoplasm of unspecified site of unspecified female breast: Secondary | ICD-10-CM

## 2011-11-21 DIAGNOSIS — E559 Vitamin D deficiency, unspecified: Secondary | ICD-10-CM

## 2011-11-21 DIAGNOSIS — Z853 Personal history of malignant neoplasm of breast: Secondary | ICD-10-CM

## 2011-11-21 NOTE — Telephone Encounter (Signed)
gve the pt her July 2013 appt calendar along with the bone density appt 

## 2011-11-21 NOTE — Progress Notes (Signed)
Hematology and Oncology Follow Up Visit  Tiffany Perkins [id number] 1960-05-18 50 y.o. 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    PAST THERAPY: As above   Interim History:  Patient is doing well she has no complaints appetite good weight is stable. She is in the same medication as before.  Medications: I have reviewed the patient's current medications.  Allergies:  Allergies  Allergen Reactions  . Penicillins     Past Medical History, Surgical history, Social history, and Family History were reviewed and updated.  Review of Systems: Constitutional:  Negative for fever, chills, night sweats, anorexia, weight loss, pain. Cardiovascular: no chest pain or dyspnea on exertion Respiratory: negative Neurological: negative Dermatological: negative ENT: negative Skin Gastrointestinal: negative Genito-Urinary: negative Hematological and Lymphatic: negative Breast: negative Musculoskeletal: negative Remaining ROS negative.  Physical Exam:  Blood pressure 151/85, pulse 92, temperature 98.1 F (36.7 C), weight 178 lb 1.6 oz (80.786 kg).  ECOG: 0 HEENT:  Sclerae anicteric, conjunctivae pink.  Oropharynx clear.  No mucositis or candidiasis.  Nodes:  No cervical, supraclavicular, or axillary lymphadenopathy palpated.  Breast Exam:  Right breast is benign.  No masses, discharge, skin change, or nipple inversion.  Left breast is benign.  No masses, discharge, skin change, or nipple inversion..  Lungs:  Clear to auscultation bilaterally.  No crackles, rhonchi, or wheezes.  Heart:  Regular rate and rhythm.  Abdomen:  Soft, nontender.  Positive bowel sounds.  No organomegaly or masses palpated.  Musculoskeletal:  No focal spinal tenderness to palpation.  Extremities:  Benign.  No peripheral edema or cyanosis.   Skin:  Benign.  Neuro:  Nonfocal.    Lab Results: Lab Results  Component Value Date   WBC 3.9 11/08/2011   HGB 12.6 11/08/2011   HCT 37.6 11/08/2011   MCV 94.5 11/08/2011   PLT 244 11/08/2011     Chemistry      Component Value Date/Time   NA 138 11/08/2011 1147   K 4.1 11/08/2011 1147   CL 104 11/08/2011 1147   CO2 25 11/08/2011 1147   BUN 22 11/08/2011 1147   CREATININE 1.25* 11/08/2011 1147      Component Value Date/Time   CALCIUM 9.3 11/08/2011 1147   ALKPHOS 61 11/08/2011 1147   AST 20 11/08/2011 1147   ALT 14 11/08/2011 1147   BILITOT 0.3 11/08/2011 1147       Radiological Studies:  No results found.   IMPRESSIONS AND PLAN: A 51 y.o. female with   History negative breast cancer status post TAC x6 followed by radiation on followup therapy. We will see her in 12 months with appropriate imaging studies.  Spent more than half the time coordinating care, as well as discussion of BMI and its implications.      Kayvion Arneson 7/11/20133:54 PM Cell [phone]

## 2011-12-04 ENCOUNTER — Other Ambulatory Visit: Payer: Self-pay | Admitting: Oncology

## 2011-12-04 DIAGNOSIS — Z853 Personal history of malignant neoplasm of breast: Secondary | ICD-10-CM

## 2011-12-04 DIAGNOSIS — Z9889 Other specified postprocedural states: Secondary | ICD-10-CM

## 2011-12-05 ENCOUNTER — Inpatient Hospital Stay: Admission: RE | Admit: 2011-12-05 | Payer: Self-pay | Source: Ambulatory Visit

## 2011-12-06 ENCOUNTER — Other Ambulatory Visit: Payer: Self-pay

## 2011-12-25 ENCOUNTER — Inpatient Hospital Stay: Admission: RE | Admit: 2011-12-25 | Payer: Self-pay | Source: Ambulatory Visit

## 2012-01-01 ENCOUNTER — Other Ambulatory Visit: Payer: Self-pay

## 2012-01-07 ENCOUNTER — Ambulatory Visit
Admission: RE | Admit: 2012-01-07 | Discharge: 2012-01-07 | Disposition: A | Discharge status: Home / Self Care | Payer: Self-pay | Source: Ambulatory Visit | Attending: Oncology | Admitting: Oncology

## 2012-01-07 DIAGNOSIS — C50919 Malignant neoplasm of unspecified site of unspecified female breast: Secondary | ICD-10-CM

## 2012-01-07 DIAGNOSIS — Z853 Personal history of malignant neoplasm of breast: Secondary | ICD-10-CM

## 2012-01-07 DIAGNOSIS — Z9889 Other specified postprocedural states: Secondary | ICD-10-CM

## 2012-03-11 ENCOUNTER — Telehealth: Payer: Self-pay | Admitting: *Deleted

## 2012-03-11 NOTE — Telephone Encounter (Signed)
Mailed out letter and calendar to inform the patient of the new date and time 

## 2012-05-19 ENCOUNTER — Other Ambulatory Visit: Payer: Self-pay | Admitting: Lab

## 2012-05-26 ENCOUNTER — Other Ambulatory Visit: Payer: Self-pay | Admitting: Lab

## 2012-05-26 ENCOUNTER — Ambulatory Visit: Payer: Self-pay | Admitting: Oncology

## 2012-06-02 ENCOUNTER — Telehealth: Payer: Self-pay | Admitting: Oncology

## 2012-06-03 ENCOUNTER — Other Ambulatory Visit: Payer: Self-pay | Admitting: Lab

## 2012-06-05 ENCOUNTER — Encounter: Payer: Self-pay | Admitting: *Deleted

## 2012-06-05 NOTE — Progress Notes (Signed)
Called and spoke with patient regarding her follow up appt.  Confirmed appt. On 1/28 with Tiffany Perkins at 1pm.  Patient requests Dr. Darnelle Catalan.

## 2012-06-09 ENCOUNTER — Ambulatory Visit (HOSPITAL_BASED_OUTPATIENT_CLINIC_OR_DEPARTMENT_OTHER): Payer: Self-pay | Admitting: Family

## 2012-06-09 ENCOUNTER — Encounter: Payer: Self-pay | Admitting: Family

## 2012-06-09 VITALS — BP 149/90 | HR 101 | Temp 97.7°F | Resp 20 | Ht 63.0 in | Wt 188.4 lb

## 2012-06-09 DIAGNOSIS — C50919 Malignant neoplasm of unspecified site of unspecified female breast: Secondary | ICD-10-CM | POA: Insufficient documentation

## 2012-06-09 DIAGNOSIS — Z853 Personal history of malignant neoplasm of breast: Secondary | ICD-10-CM

## 2012-06-09 DIAGNOSIS — Z171 Estrogen receptor negative status [ER-]: Secondary | ICD-10-CM

## 2012-06-09 NOTE — Patient Instructions (Addendum)
Please contact us at [phone] if you have any questions or concerns. 

## 2012-06-09 NOTE — Progress Notes (Signed)
ID: Lenetta Quaker   DOB: 1961/01/16  MR#: [id number]  ION#:[id number]  PCP: Imran P. 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    INTERVAL HISTORY: Dr. Darnelle Catalan and I saw Tiffany Perkins today for follow up of stage IIIA T1N2 invasive ductal carcinoma of the left breast.  Since her last office visit with Dr. Donnie Coffin on 11/21/2011, she reports that she has been doing well and denies any symptomatology, breast changes, ER visits or hospitalizations.    REVIEW OF SYSTEMS: Constitutional: Negative HEENT: Negative Respiratory: Negative Cardiovascular: Negative Breasts:  Negative Gastrointestinal: Negative Genitourinary: Negative Musculoskeletal: Negative Skin: Negative Neurological: Negative Hematological: Negative Psychiatric/Behavioral: Negative   PAST MEDICAL HISTORY: Past Medical History  Diagnosis Date  . Breast cancer   . Hypertension     PAST SURGICAL HISTORY: Past Surgical History  Procedure Date  . Breast lumpectomy 2005  . Tubal ligation     FAMILY HISTORY Family History  Problem Relation Age of Onset  . Diabetes Mother   . Heart Problems Mother   . Diabetes Sister   . Diabetes Sister   . Diabetes Brother   . Diabetes Brother   . Diabetes Brother   . Stroke Brother    GYNECOLOGIC HISTORY: [id number],  age of menarche is 52, age of parity 52  SOCIAL HISTORY: Tiffany Perkins is married.  Her husband operates a Systems [employer]. 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 This is her second marriage. Her first marriage lasted 10 years. She has 3 adult children living outside of the home.   ADVANCED DIRECTIVES:  Not on file.  HEALTH MAINTENANCE: History  Substance Use Topics  . Smoking status:  Never Smoker   . Smokeless tobacco: Never Used  . Alcohol Use: Yes     Comment: Occasional glass of wine   Mammogram:  01/07/2012 showed no specific mammographic evidence of malignancy. Post lumpectomy scarring in the inferior left breast. Colonoscopy:  Patient declines Pap:  Not on file Bone density:  01/07/2012 T-score 1.4   Allergies  Allergen Reactions  . Penicillins     Current Outpatient Prescriptions  Medication Sig Dispense Refill  . calcium carbonate (OS-CAL) 600 MG TABS Take 600 mg by mouth 2 (two) times daily with a meal.      . cholecalciferol (VITAMIN D) 1000 UNITS tablet Take 1,000 Units by mouth daily.        OBJECTIVE: Filed Vitals:   06/09/12 1304  BP: 149/90  Pulse: 101  Temp: 97.7 F (36.5 C)  Resp: 20     Body mass index is 33.37 kg/(m^2).      ECOG FS:  Grade 0 - Fully active  General appearance: Alert, cooperative, well nourished, no apparent distress Head: Normocephalic, without obvious abnormality, atraumatic Eyes: Conjunctivae/corneas clear, PERRLA, EOMI Nose: Nares, septum and mucosa are normal, no drainage or sinus tenderness Neck: No adenopathy, supple, symmetrical, trachea midline, thyroid not enlarged, no tenderness Resp: Clear to auscultation bilaterally Cardio: Regular rate and rhythm, S1, S2 normal, no murmur, click, rub or gallop Breasts: Soft bilaterally, well-healed left breast surgical scar, no lymphadenopathy, no nipple inversion, no axilla fullness, benign breast exam GI: Soft, distended, non-tender, hypoactive bowel sounds, no organomegaly Extremities: Extremities normal, atraumatic, no cyanosis or edema Lymph nodes: Cervical,  supraclavicular, and axillary nodes normal, well-healed left axillary scar Neurologic: Grossly normal   LAB RESULTS: Lab Results  Component Value Date   WBC 3.9 11/08/2011   NEUTROABS 1.8 11/08/2011   HGB 12.6 11/08/2011   HCT 37.6 11/08/2011   MCV 94.5 11/08/2011   PLT 244 11/08/2011      Chemistry       Component Value Date/Time   NA 138 11/08/2011 1147   K 4.1 11/08/2011 1147   CL 104 11/08/2011 1147   CO2 25 11/08/2011 1147   BUN 22 11/08/2011 1147   CREATININE 1.25* 11/08/2011 1147      Component Value Date/Time   CALCIUM 9.3 11/08/2011 1147   ALKPHOS 61 11/08/2011 1147   AST 20 11/08/2011 1147   ALT 14 11/08/2011 1147   BILITOT 0.3 11/08/2011 1147       Lab Results  Component Value Date   LABCA2 17 11/08/2011     Urinalysis No results found for this basename: colorurine,  appearanceur,  labspec,  phurine,  glucoseu,  hgbur,  bilirubinur,  ketonesur,  proteinur,  urobilinogen,  nitrite,  leukocytesur    STUDIES: No results found.  ASSESSMENT: 52 y.o. woman who lives in [place], [place] with a history of stage IIIA T1 N2 invasive ductal carcinoma of the left breast 1.8 cm grade 3/3 , triple-negative, status post lumpectomy with lymph node dissection (4/16 metastatic nodes) on 02/08/2004 and 6 cycles of TAC chemotherapy in 07/2004 and radiation therapy completed 09/2004.   PLAN: (1)  Dr. Darnelle Catalan gave the patient the option of continuing annual office visits at the cancer center with him or to continue annual breast examinations with her PCP and annual mammograms.  The patient opted for the latter and will call us on an as needed basis.   Tiffany Perkins was urged to obtain a colonoscopy.  Tiffany Perkins was also encouraged to contact us if she has any questions or concerns.    Larina Bras, NP-C    06/09/2012

## 2012-06-10 ENCOUNTER — Ambulatory Visit: Payer: Self-pay | Admitting: Oncology

## 2012-06-11 ENCOUNTER — Other Ambulatory Visit: Payer: Self-pay | Admitting: Oncology

## 2012-06-11 ENCOUNTER — Encounter: Payer: Self-pay | Admitting: Oncology

## 2013-01-18 ENCOUNTER — Other Ambulatory Visit: Payer: Self-pay

## 2013-01-18 DIAGNOSIS — Z1231 Encounter for screening mammogram for malignant neoplasm of breast: Secondary | ICD-10-CM

## 2013-02-08 ENCOUNTER — Ambulatory Visit
Admission: RE | Admit: 2013-02-08 | Discharge: 2013-02-08 | Disposition: A | Discharge status: Home / Self Care | Payer: No Typology Code available for payment source | Source: Ambulatory Visit

## 2013-02-08 DIAGNOSIS — Z1231 Encounter for screening mammogram for malignant neoplasm of breast: Secondary | ICD-10-CM

## 2013-02-09 ENCOUNTER — Emergency Department (HOSPITAL_COMMUNITY)
Admission: EM | Admit: 2013-02-09 | Discharge: 2013-02-09 | Disposition: A | Discharge status: Home / Self Care | Payer: No Typology Code available for payment source | Attending: Emergency Medicine | Admitting: Emergency Medicine

## 2013-02-09 ENCOUNTER — Encounter (HOSPITAL_COMMUNITY): Payer: Self-pay | Admitting: Vascular Surgery

## 2013-02-09 ENCOUNTER — Emergency Department (HOSPITAL_COMMUNITY): Payer: No Typology Code available for payment source

## 2013-02-09 DIAGNOSIS — IMO0002 Reserved for concepts with insufficient information to code with codable children: Secondary | ICD-10-CM | POA: Insufficient documentation

## 2013-02-09 DIAGNOSIS — Z853 Personal history of malignant neoplasm of breast: Secondary | ICD-10-CM | POA: Insufficient documentation

## 2013-02-09 DIAGNOSIS — I10 Essential (primary) hypertension: Secondary | ICD-10-CM | POA: Insufficient documentation

## 2013-02-09 DIAGNOSIS — S0993XA Unspecified injury of face, initial encounter: Secondary | ICD-10-CM | POA: Insufficient documentation

## 2013-02-09 DIAGNOSIS — Y92410 Unspecified street and highway as the place of occurrence of the external cause: Secondary | ICD-10-CM | POA: Insufficient documentation

## 2013-02-09 DIAGNOSIS — Y9389 Activity, other specified: Secondary | ICD-10-CM | POA: Insufficient documentation

## 2013-02-09 MED ORDER — FENTANYL CITRATE 0.05 MG/ML IJ SOLN
50.0000 ug | Freq: Once | INTRAMUSCULAR | Status: AC
  Administered 2013-02-09: 50 ug via INTRAMUSCULAR
  Filled 2013-02-09: qty 2

## 2013-02-09 MED ORDER — ONDANSETRON HCL 4 MG PO TABS: 4.0000 mg | ORAL_TABLET | Freq: Four times a day (QID) | ORAL | Status: DC

## 2013-02-09 MED ORDER — HYDROCODONE-ACETAMINOPHEN 5-325 MG PO TABS: 1.0000 | ORAL_TABLET | ORAL | Status: DC | PRN

## 2013-02-09 MED ORDER — METHOCARBAMOL 500 MG PO TABS: 500.0000 mg | ORAL_TABLET | Freq: Two times a day (BID) | ORAL | Status: DC
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
## 2013-02-09 NOTE — ED Provider Notes (Signed)
Medical screening examination/treatment/procedure(s) were performed by non-physician practitioner and as supervising physician I was immediately available for consultation/collaboration. Tiffany Albe, MD, [facility]ando Gang   Ward Givens, MD 02/09/13 2012
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
## 2014-02-04 ENCOUNTER — Other Ambulatory Visit: Payer: Self-pay

## 2014-02-04 DIAGNOSIS — Z853 Personal history of malignant neoplasm of breast: Secondary | ICD-10-CM

## 2014-02-04 DIAGNOSIS — Z9889 Other specified postprocedural states: Secondary | ICD-10-CM

## 2014-02-04 DIAGNOSIS — Z1231 Encounter for screening mammogram for malignant neoplasm of breast: Secondary | ICD-10-CM

## 2014-02-16 ENCOUNTER — Ambulatory Visit
Admission: RE | Admit: 2014-02-16 | Discharge: 2014-02-16 | Disposition: A | Discharge status: Home / Self Care | Payer: BC Managed Care – PPO | Source: Ambulatory Visit

## 2014-02-16 DIAGNOSIS — Z853 Personal history of malignant neoplasm of breast: Secondary | ICD-10-CM

## 2014-02-16 DIAGNOSIS — Z9889 Other specified postprocedural states: Secondary | ICD-10-CM

## 2014-02-16 DIAGNOSIS — Z1231 Encounter for screening mammogram for malignant neoplasm of breast: Secondary | ICD-10-CM

## 2014-02-18 ENCOUNTER — Other Ambulatory Visit: Payer: Self-pay | Admitting: Oncology

## 2014-02-18 DIAGNOSIS — R928 Other abnormal and inconclusive findings on diagnostic imaging of breast: Secondary | ICD-10-CM

## 2014-03-01 ENCOUNTER — Ambulatory Visit
Admission: RE | Admit: 2014-03-01 | Discharge: 2014-03-01 | Disposition: A | Discharge status: Home / Self Care | Payer: BC Managed Care – PPO | Source: Ambulatory Visit | Attending: Oncology | Admitting: Oncology

## 2014-03-01 DIAGNOSIS — R928 Other abnormal and inconclusive findings on diagnostic imaging of breast: Secondary | ICD-10-CM

## 2015-01-31 ENCOUNTER — Other Ambulatory Visit: Payer: Self-pay

## 2015-01-31 DIAGNOSIS — Z1231 Encounter for screening mammogram for malignant neoplasm of breast: Secondary | ICD-10-CM

## 2015-02-20 ENCOUNTER — Ambulatory Visit
Admission: RE | Admit: 2015-02-20 | Discharge: 2015-02-20 | Disposition: A | Discharge status: Home / Self Care | Payer: BLUE CROSS/BLUE SHIELD | Source: Ambulatory Visit

## 2015-02-20 DIAGNOSIS — Z1231 Encounter for screening mammogram for malignant neoplasm of breast: Secondary | ICD-10-CM

## 2015-11-03 ENCOUNTER — Encounter: Payer: Self-pay | Admitting: Genetic Counselor

## 2016-01-29 ENCOUNTER — Other Ambulatory Visit: Payer: Self-pay | Admitting: Internal Medicine

## 2016-01-29 DIAGNOSIS — Z1231 Encounter for screening mammogram for malignant neoplasm of breast: Secondary | ICD-10-CM

## 2016-02-21 ENCOUNTER — Ambulatory Visit
Admission: RE | Admit: 2016-02-21 | Discharge: 2016-02-21 | Disposition: A | Discharge status: Home / Self Care | Payer: BLUE CROSS/BLUE SHIELD | Source: Ambulatory Visit | Attending: Internal Medicine | Admitting: Internal Medicine

## 2016-02-21 DIAGNOSIS — Z1231 Encounter for screening mammogram for malignant neoplasm of breast: Secondary | ICD-10-CM

## 2016-03-27 HISTORY — PX: COLONOSCOPY: SHX5424

## 2017-02-12 ENCOUNTER — Other Ambulatory Visit: Payer: Self-pay | Admitting: Internal Medicine

## 2017-03-05 ENCOUNTER — Other Ambulatory Visit: Payer: Self-pay | Admitting: Internal Medicine

## 2017-03-05 DIAGNOSIS — Z1231 Encounter for screening mammogram for malignant neoplasm of breast: Secondary | ICD-10-CM

## 2017-03-12 ENCOUNTER — Ambulatory Visit
Admission: RE | Admit: 2017-03-12 | Discharge: 2017-03-12 | Disposition: A | Discharge status: Home / Self Care | Payer: No Typology Code available for payment source | Source: Ambulatory Visit | Attending: Internal Medicine | Admitting: Internal Medicine

## 2017-03-12 DIAGNOSIS — Z1231 Encounter for screening mammogram for malignant neoplasm of breast: Secondary | ICD-10-CM

## 2018-02-16 ENCOUNTER — Other Ambulatory Visit: Payer: Self-pay | Admitting: Internal Medicine

## 2018-02-16 DIAGNOSIS — Z1231 Encounter for screening mammogram for malignant neoplasm of breast: Secondary | ICD-10-CM

## 2018-03-17 ENCOUNTER — Ambulatory Visit
Admission: RE | Admit: 2018-03-17 | Discharge: 2018-03-17 | Disposition: A | Discharge status: Home / Self Care | Payer: No Typology Code available for payment source | Source: Ambulatory Visit | Attending: Internal Medicine | Admitting: Internal Medicine

## 2018-03-17 DIAGNOSIS — Z1231 Encounter for screening mammogram for malignant neoplasm of breast: Secondary | ICD-10-CM

## 2019-02-16 ENCOUNTER — Other Ambulatory Visit: Payer: Self-pay | Admitting: Internal Medicine

## 2019-02-16 DIAGNOSIS — Z1231 Encounter for screening mammogram for malignant neoplasm of breast: Secondary | ICD-10-CM

## 2019-04-02 ENCOUNTER — Other Ambulatory Visit: Payer: Self-pay

## 2019-04-02 ENCOUNTER — Ambulatory Visit
Admission: RE | Admit: 2019-04-02 | Discharge: 2019-04-02 | Disposition: A | Discharge status: Home / Self Care | Payer: No Typology Code available for payment source | Source: Ambulatory Visit | Attending: Internal Medicine | Admitting: Internal Medicine

## 2019-04-02 DIAGNOSIS — Z1231 Encounter for screening mammogram for malignant neoplasm of breast: Secondary | ICD-10-CM

## 2020-02-23 ENCOUNTER — Other Ambulatory Visit: Payer: Self-pay | Admitting: Internal Medicine

## 2020-02-23 DIAGNOSIS — Z1231 Encounter for screening mammogram for malignant neoplasm of breast: Secondary | ICD-10-CM

## 2020-04-03 ENCOUNTER — Ambulatory Visit
Admission: RE | Admit: 2020-04-03 | Discharge: 2020-04-03 | Disposition: A | Discharge status: Home / Self Care | Payer: 59 | Source: Ambulatory Visit | Attending: Internal Medicine | Admitting: Internal Medicine

## 2020-04-03 ENCOUNTER — Other Ambulatory Visit: Payer: Self-pay

## 2020-04-03 DIAGNOSIS — Z1231 Encounter for screening mammogram for malignant neoplasm of breast: Secondary | ICD-10-CM

## 2020-09-02 ENCOUNTER — Other Ambulatory Visit: Payer: Self-pay

## 2020-09-02 ENCOUNTER — Encounter (HOSPITAL_COMMUNITY): Payer: Self-pay

## 2020-09-02 ENCOUNTER — Emergency Department (HOSPITAL_COMMUNITY): Payer: 59

## 2020-09-02 ENCOUNTER — Emergency Department (HOSPITAL_COMMUNITY)
Admission: EM | Admit: 2020-09-02 | Discharge: 2020-09-02 | Disposition: A | Discharge status: Home / Self Care | Payer: 59 | Attending: Emergency Medicine | Admitting: Emergency Medicine

## 2020-09-02 DIAGNOSIS — I10 Essential (primary) hypertension: Secondary | ICD-10-CM | POA: Insufficient documentation

## 2020-09-02 DIAGNOSIS — T148XXA Other injury of unspecified body region, initial encounter: Secondary | ICD-10-CM

## 2020-09-02 DIAGNOSIS — Z853 Personal history of malignant neoplasm of breast: Secondary | ICD-10-CM | POA: Insufficient documentation

## 2020-09-02 DIAGNOSIS — S299XXA Unspecified injury of thorax, initial encounter: Secondary | ICD-10-CM | POA: Diagnosis present

## 2020-09-02 DIAGNOSIS — W108XXA Fall (on) (from) other stairs and steps, initial encounter: Secondary | ICD-10-CM | POA: Insufficient documentation

## 2020-09-02 DIAGNOSIS — S29012A Strain of muscle and tendon of back wall of thorax, initial encounter: Secondary | ICD-10-CM | POA: Diagnosis not present

## 2020-09-02 MED ORDER — ACETAMINOPHEN 325 MG PO TABS
650.0000 mg | ORAL_TABLET | Freq: Once | ORAL | Status: AC
  Administered 2020-09-02: 650 mg via ORAL
  Filled 2020-09-02: qty 2

## 2020-09-02 MED ORDER — LIDOCAINE 5 % EX PTCH: 1.0000 | MEDICATED_PATCH | CUTANEOUS | 0 refills | Status: DC

## 2020-09-02 MED ORDER — LIDOCAINE 5 % EX PTCH
1.0000 | MEDICATED_PATCH | CUTANEOUS | Status: DC
  Administered 2020-09-02: 1 via TRANSDERMAL
  Filled 2020-09-02: qty 1

## 2020-09-02 NOTE — ED Provider Notes (Signed)
[facility] DEPT Provider Note   CSN: [id number] Arrival date & time: 09/02/20  1310     History Chief Complaint  Patient presents with  . Back Pain    Tiffany Perkins is a 60 y.o. female.  HPI 60 year old female with a history of breast cancer, hypertension presents to the ER with left-sided back pain x1 day.  Patient states that she missed a step walking down the stairs about 3 to 4 days ago and fell forward.  Yesterday and today she noticed spasming in her left mid back.  She denies any loss of bowel bladder control, footdrop, numbness or tingling.  She denies any urinary symptoms, no dysuria, hematuria.  She states that she was told that she was pretty kidney failure by her PCP several months ago and was concerned about her kidneys.  Does describe the pain as spasms, worsen when she sits for a long time in the car.  She has not taken anything for her pain.  No night sweats, unintended weight loss.    Past Medical History:  Diagnosis Date  . Breast cancer ([place])   . Hypertension     Patient Active Problem List   Diagnosis Date Noted  . Breast cancer ([place]) 06/09/2012    Past Surgical History:  Procedure Laterality Date  . BREAST LUMPECTOMY Left 2005  . TUBAL LIGATION       OB History   No obstetric history on file.     Family History  Problem Relation Age of Onset  . Diabetes Mother   . Heart Problems Mother   . Diabetes Sister   . Diabetes Sister   . Diabetes Brother   . Diabetes Brother   . Diabetes Brother   . Stroke Brother     Social History   Tobacco Use  . Smoking status: Never Smoker  . Smokeless tobacco: Never Used  Substance Use Topics  . Alcohol use: Yes    Comment: Occasional glass of wine  . Drug use: No    Home Medications Prior to Admission medications   Medication Sig Start Date End Date Taking? Authorizing Provider  calcium carbonate (OS-CAL) 600 MG TABS Take 600 mg by mouth 2 (two) times daily with  a meal.    [provider]  cholecalciferol (VITAMIN D) 1000 UNITS tablet Take 1,000 Units by mouth daily.    [provider]  HYDROcodone-acetaminophen (NORCO/VICODIN) 5-325 MG per tablet Take 1 tablet by mouth every 4 (four) hours as needed for pain. 02/09/13   Domenic Moras, PA-C  methocarbamol (ROBAXIN) 500 MG tablet Take 1 tablet (500 mg total) by mouth 2 (two) times daily. 02/09/13   Domenic Moras, PA-C  ondansetron (ZOFRAN) 4 MG tablet Take 1 tablet (4 mg total) by mouth every 6 (six) hours. 02/09/13   Domenic Moras, PA-C    Allergies    Penicillins  Review of Systems   Review of Systems  Constitutional: Negative for chills, fever and unexpected weight change.  Genitourinary: Negative for dysuria, flank pain and hematuria.  Musculoskeletal: Positive for back pain.    Physical Exam Updated Vital Signs BP (!) 152/87 (BP Location: Right Arm)   Pulse 88   Temp 98.1 F (36.7 C) (Oral)   Resp 16   SpO2 100%   Physical Exam Vitals and nursing note reviewed.  Constitutional:      General: She is not in acute distress.    Appearance: She is well-developed.  HENT:     Head:  Normocephalic and atraumatic.  Eyes:     Conjunctiva/sclera: Conjunctivae normal.  Cardiovascular:     Rate and Rhythm: Normal rate and regular rhythm.     Heart sounds: No murmur heard.   Pulmonary:     Effort: Pulmonary effort is normal. No respiratory distress.     Breath sounds: Normal breath sounds.  Abdominal:     Palpations: Abdomen is soft.     Tenderness: There is no abdominal tenderness.  Musculoskeletal:       Arms:     Cervical back: Neck supple.     Comments: Mid thoracic paraspinal muscle tenderness.  No C, T, L-spine tenderness.  No CVA tenderness bilaterally.  Patient moving all 4 extremities no difficulty.  No noticeable step-offs or crepitus.  Skin:    General: Skin is warm and dry.  Neurological:     Mental Status: She is alert.     ED Results / Procedures /  Treatments   Labs (all labs ordered are listed, but only abnormal results are displayed) Labs Reviewed - No data to display  EKG None  Radiology DG Thoracic Spine 2 View  Result Date: 09/02/2020 CLINICAL DATA:  Back pain.  Spasms.  No known injury. EXAM: THORACIC SPINE 2 VIEWS COMPARISON:  None. FINDINGS: No fracture or traumatic malalignment. Multilevel mild degenerative disc disease with tiny anterior osteophytes. IMPRESSION: Mild degenerative disc disease.  No other abnormalities. Electronically Signed   By: Dorise Bullion III M.D   On: 09/02/2020 15:19    Procedures Procedures   Medications Ordered in ED Medications  lidocaine (LIDODERM) 5 % 1 patch (1 patch Transdermal Patch Applied 09/02/20 1422)  acetaminophen (TYLENOL) tablet 650 mg (650 mg Oral Given 09/02/20 1422)    ED Course  I have reviewed the triage vital signs and the nursing notes.  Pertinent labs & imaging results that were available during my care of the patient were reviewed by me and considered in my medical decision making (see chart for details).    MDM Rules/Calculators/A&P                          60 year old female presents to the ER with left-sided back pain.  She states that she had fallen down some steps several days ago, denies falling onto her back or hitting her head.  She states that yesterday she started to develop some muscle spasms in her back.  She denies any urinary symptoms, low suspicion for kidney stones as she has no CVA tenderness, no hematuria, no dysuria or frequency.  No evidence of cauda equina.  Her vitals on arrival are overall reassuring, though she is slightly hypertensive with a blood pressure 152/87, patient with a history of hypertension.  No evidence of cauda equina, low suspicion for UTI or kidney stone.  Thoracic x-ray ordered given history of cancer, without any evidence of mets.  Suspect muscle strain.  Patient already has muscle relaxers at home, encouraged Tylenol, will  prescribe Lidoderm patch.  Encouraged PCP follow-up.  Return precautions discussed.  She voiced understanding and is agreeable. Final Clinical Impression(s) / ED Diagnoses Final diagnoses:  Muscle strain    Rx / DC Orders ED Discharge Orders    None       Lyndel Safe 09/02/20 1523    Gareth Morgan, MD 09/05/20 1140

## 2020-09-02 NOTE — Discharge Instructions (Addendum)
Your x-ray today was overall reassuring.  Take Tylenol for pain, you may also take a muscle relaxer at night due to a drink or drive on it as it can make you sleepy.  You may also use a Lidoderm patch.  Please make sure to follow-up with your primary care doctor if your symptoms continue.  Return to the ER any new or worsening symptoms

## 2020-09-02 NOTE — ED Triage Notes (Signed)
Pt reports left mid back pain/spasms that began yesterday. Denies known injury. Pt reports hx of low back pain and spasms.

## 2021-01-25 ENCOUNTER — Encounter: Payer: Self-pay | Admitting: Gastroenterology

## 2021-02-23 ENCOUNTER — Other Ambulatory Visit: Payer: Self-pay | Admitting: Internal Medicine

## 2021-02-23 DIAGNOSIS — Z1231 Encounter for screening mammogram for malignant neoplasm of breast: Secondary | ICD-10-CM

## 2021-03-14 ENCOUNTER — Encounter: Payer: Self-pay | Admitting: Gastroenterology

## 2021-04-04 ENCOUNTER — Ambulatory Visit
Admission: RE | Admit: 2021-04-04 | Discharge: 2021-04-04 | Disposition: A | Discharge status: Home / Self Care | Payer: 59 | Source: Ambulatory Visit | Attending: Internal Medicine | Admitting: Internal Medicine

## 2021-04-04 ENCOUNTER — Other Ambulatory Visit: Payer: Self-pay

## 2021-04-04 DIAGNOSIS — Z1231 Encounter for screening mammogram for malignant neoplasm of breast: Secondary | ICD-10-CM

## 2021-04-10 ENCOUNTER — Telehealth: Payer: Self-pay

## 2021-04-10 ENCOUNTER — Encounter: Payer: 59 | Admitting: Gastroenterology

## 2021-04-10 NOTE — Telephone Encounter (Signed)
I have called and left a voicemail for patient to call back and schedule an appointment with previsit for recall colonoscopy.

## 2021-05-18 ENCOUNTER — Ambulatory Visit (AMBULATORY_SURGERY_CENTER): Payer: 59 | Admitting: *Deleted

## 2021-05-18 ENCOUNTER — Other Ambulatory Visit: Payer: Self-pay

## 2021-05-18 VITALS — Ht 63.0 in | Wt 166.0 lb

## 2021-05-18 DIAGNOSIS — Z1211 Encounter for screening for malignant neoplasm of colon: Secondary | ICD-10-CM

## 2021-05-18 MED ORDER — NA SULFATE-K SULFATE-MG SULF 17.5-3.13-1.6 GM/177ML PO SOLN: 1.0000 | ORAL | 0 refills | Status: DC

## 2021-05-18 NOTE — Progress Notes (Signed)
Patient's pre-visit was done today over the phone with the patient. Name,DOB and address verified. Patient denies any allergies to Eggs and Soy. Patient denies any problems with anesthesia/sedation. Patient is not taking any diet pills or blood thinners. No home Oxygen. Packet of Prep instructions mailed to patient including a copy of a consent form-pt is aware. Prep instructions sent to pt's MyChart (if activated).Patient understands to call us back with any questions or concerns. Patient is aware of our care-partner policy and [id number] safety protocol.     The patient is COVID-19 vaccinated.

## 2021-06-01 ENCOUNTER — Encounter: Payer: 59 | Admitting: Gastroenterology

## 2021-07-04 ENCOUNTER — Telehealth: Payer: Self-pay | Admitting: Gastroenterology

## 2021-07-04 NOTE — Telephone Encounter (Signed)
Printed new  suprep instructions for 3-8- mailed to pt also emailed to [email or web address]- pt aware

## 2021-07-04 NOTE — Telephone Encounter (Signed)
Patient called requesting we send her updated prep instructions.

## 2021-07-18 ENCOUNTER — Ambulatory Visit (AMBULATORY_SURGERY_CENTER): Payer: PRIVATE HEALTH INSURANCE | Admitting: Gastroenterology

## 2021-07-18 ENCOUNTER — Encounter: Payer: Self-pay | Admitting: Gastroenterology

## 2021-07-18 ENCOUNTER — Other Ambulatory Visit: Payer: Self-pay

## 2021-07-18 VITALS — BP 114/69 | HR 93 | Temp 98.4°F | Resp 15 | Ht 63.0 in | Wt 166.0 lb

## 2021-07-18 DIAGNOSIS — Z1211 Encounter for screening for malignant neoplasm of colon: Secondary | ICD-10-CM | POA: Diagnosis present

## 2021-07-18 DIAGNOSIS — K5289 Other specified noninfective gastroenteritis and colitis: Secondary | ICD-10-CM

## 2021-07-18 DIAGNOSIS — K529 Noninfective gastroenteritis and colitis, unspecified: Secondary | ICD-10-CM | POA: Diagnosis not present

## 2021-07-18 MED ORDER — SODIUM CHLORIDE 0.9 % IV SOLN: 500.0000 mL | Freq: Once | INTRAVENOUS | Status: DC

## 2021-07-18 NOTE — Progress Notes (Signed)
CNW - VS ? ?Pt's states no medical or surgical changes since previsit or office visit.  ?

## 2021-07-18 NOTE — Patient Instructions (Signed)
YOU HAD AN ENDOSCOPIC PROCEDURE TODAY AT [facility]:   Refer to the procedure report that was given to you for any specific questions about what was found during the examination.  If the procedure report does not answer your questions, please call your gastroenterologist to clarify.  If you requested that your care partner not be given the details of your procedure findings, then the procedure report has been included in a sealed envelope for you to review at your convenience later. ? ?**Handouts given on hemorrhoids and diverticulosis** ? ?YOU SHOULD EXPECT: Some feelings of bloating in the abdomen. Passage of more gas than usual.  Walking can help get rid of the air that was put into your GI tract during the procedure and reduce the bloating. If you had a lower endoscopy (such as a colonoscopy or flexible sigmoidoscopy) you may notice spotting of blood in your stool or on the toilet paper. If you underwent a bowel prep for your procedure, you may not have a normal bowel movement for a few days. ? ?Please Note:  You might notice some irritation and congestion in your nose or some drainage.  This is from the oxygen used during your procedure.  There is no need for concern and it should clear up in a day or so. ? ?SYMPTOMS TO REPORT IMMEDIATELY: ? ?Following lower endoscopy (colonoscopy or flexible sigmoidoscopy): ? Excessive amounts of blood in the stool ? Significant tenderness or worsening of abdominal pains ? Swelling of the abdomen that is new, acute ? Fever of 100?F or higher ? ? ?For urgent or emergent issues, a gastroenterologist can be reached at any hour by calling [phone]. ?Do not use MyChart messaging for urgent concerns.  ? ? ?DIET:  We do recommend a small meal at first, but then you may proceed to your regular diet.  Drink plenty of fluids but you should avoid alcoholic beverages for 24 hours. ? ?ACTIVITY:  You should plan to take it easy for the rest of today and you should  NOT DRIVE or use heavy machinery until tomorrow (because of the sedation medicines used during the test).   ? ?FOLLOW UP: ?Our staff will call the number listed on your records 48-72 hours following your procedure to check on you and address any questions or concerns that you may have regarding the information given to you following your procedure. If we do not reach you, we will leave a message.  We will attempt to reach you two times.  During this call, we will ask if you have developed any symptoms of COVID 19. If you develop any symptoms (ie: fever, flu-like symptoms, shortness of breath, cough etc.) before then, please call [phone].  If you test positive for Covid 19 in the 2 weeks post procedure, please call and report this information to [place].   ? ?If any biopsies were taken you will be contacted by phone or by letter within the next 1-3 weeks.  Please call us at [phone] if you have not heard about the biopsies in 3 weeks.  ? ? ?SIGNATURES/CONFIDENTIALITY: ?You and/or your care partner have signed paperwork which will be entered into your electronic medical record.  These signatures attest to the fact that that the information above on your After Visit Summary has been reviewed and is understood.  Full responsibility of the confidentiality of this discharge information lies with you and/or your care-partner.  ?

## 2021-07-18 NOTE — Progress Notes (Signed)
PT taken to PACU. Monitors in place. VSS. Report given to RN. 

## 2021-07-18 NOTE — Progress Notes (Signed)
[facility] History and Physical ? ? ?Primary Care Physician:  Bonnita Nasuti, MD ? ? ?Reason for Procedure:  High risk screening (H/O polyps) ? ?Plan:     colonoscopy ? ? ? ? ?HPI: Tiffany Perkins is a 61 y.o. female  ? ? ?Past Medical History:  ?Diagnosis Date  ? Breast cancer ([place])   ? Hypertension   ? ? ?Past Surgical History:  ?Procedure Laterality Date  ? BREAST LUMPECTOMY Left 2005  ? TUBAL LIGATION    ? ? ?Prior to Admission medications   ?Medication Sig Start Date End Date Taking? Authorizing Provider  ?amLODipine (NORVASC) 10 MG tablet Take 10 mg by mouth daily. 01/22/21  Yes [provider]  ?Gelatin-Mineral Combinations (NAIL STRENGTHENER/GELATIN) CAPS Take by mouth.    [provider]  ? ? ?Current Outpatient Medications  ?Medication Sig Dispense Refill  ? amLODipine (NORVASC) 10 MG tablet Take 10 mg by mouth daily.    ? Gelatin-Mineral Combinations (NAIL STRENGTHENER/GELATIN) CAPS Take by mouth.    ? ?Current Facility-Administered Medications  ?Medication Dose Route Frequency Provider Last Rate Last Admin  ? 0.9 %  sodium chloride infusion  500 mL Intravenous Once Jackquline Denmark, MD      ? ? ?Allergies as of 07/18/2021 - Review Complete 07/18/2021  ?Allergen Reaction Noted  ? Hydrocodone-acetaminophen Nausea Only 06/22/2021  ? Oxycodone-acetaminophen Nausea And Vomiting 06/22/2021  ? Penicillins  11/21/2011  ? ? ?Family History  ?Problem Relation Age of Onset  ? Diabetes Mother   ? Heart Problems Mother   ? Diabetes Sister   ? Diabetes Sister   ? Diabetes Brother   ? Diabetes Brother   ? Diabetes Brother   ? Stroke Brother   ? Esophageal cancer Other   ? Stomach cancer Other   ? Rectal cancer Other   ? Colon cancer Other   ? Colon polyps Other   ? ? ?Social History  ? ?Socioeconomic History  ? Marital status: Married  ?  Spouse name: Not on file  ? Number of children: Not on file  ? Years of education: Not on file  ? Highest education level: Not on file  ?Occupational  History  ? Not on file  ?Tobacco Use  ? Smoking status: Never  ? Smokeless tobacco: Never  ?Vaping Use  ? Vaping Use: Never used  ?Substance and Sexual Activity  ? Alcohol use: Yes  ?  Alcohol/week: 1.0 standard drink  ?  Types: 1 Glasses of wine per week  ?  Comment: Occasional glass of wine  ? Drug use: No  ? Sexual activity: Not on file  ?Other Topics Concern  ? Not on file  ?Social History Narrative  ? Not on file  ? ?Social Determinants of Health  ? ?Financial Resource Strain: Not on file  ?Food Insecurity: Not on file  ?Transportation Needs: Not on file  ?Physical Activity: Not on file  ?Stress: Not on file  ?Social Connections: Not on file  ?Intimate Partner Violence: Not on file  ? ? ?Review of Systems: ?Positive for  none ?All other review of systems negative except as mentioned in the HPI. ? ?Physical Exam: ?Vital signs in last 24 hours: ?'@VSRANGES'$ @ ?  ?General:   Alert,  Well-developed, well-nourished, pleasant and cooperative in NAD ?Lungs:  Clear throughout to auscultation.   ?Heart:  Regular rate and rhythm; no murmurs, clicks, rubs,  or gallops. ?Abdomen:  Soft, nontender and nondistended. Normal bowel sounds.   ?Neuro/Psych:  Alert and  cooperative. Normal mood and affect. A and O x 3 ? ? ? ?No significant changes were identified.  The patient continues to be an appropriate candidate for the planned procedure and anesthesia. ? ? ?Carmell Austria, MD. ?[facility] ?07/18/2021 9:19 AM@ ? ?

## 2021-07-18 NOTE — Progress Notes (Signed)
Called to room to assist during endoscopic procedure.  Patient ID and intended procedure confirmed with present staff. Received instructions for my participation in the procedure from the performing physician.  

## 2021-07-18 NOTE — Op Note (Signed)
[place] ?Patient Name: Tiffany Perkins ?Procedure Date: 07/18/2021 9:24 AM ?MRN: [id number] ?Endoscopist: Jackquline Denmark , MD ?Age: 61 ?Referring MD:  ?Date of Birth: 10-Dec-1960 ?Gender: Female ?Account #: [id number] ?Procedure:                Colonoscopy ?Indications:              Screening for colorectal malignant neoplasm ?Medicines:                Monitored Anesthesia Care ?Procedure:                Pre-Anesthesia Assessment: ?                          - Prior to the procedure, a History and Physical  ?                          was performed, and patient medications and  ?                          allergies were reviewed. The patient's tolerance of  ?                          previous anesthesia was also reviewed. The risks  ?                          and benefits of the procedure and the sedation  ?                          options and risks were discussed with the patient.  ?                          All questions were answered, and informed consent  ?                          was obtained. Prior Anticoagulants: The patient has  ?                          taken no previous anticoagulant or antiplatelet  ?                          agents. ASA Grade Assessment: II - A patient with  ?                          mild systemic disease. After reviewing the risks  ?                          and benefits, the patient was deemed in  ?                          satisfactory condition to undergo the procedure. ?                          After obtaining informed consent, the colonoscope  ?  was passed under direct vision. Throughout the  ?                          procedure, the patient's blood pressure, pulse, and  ?                          oxygen saturations were monitored continuously. The  ?                          Olympus PCF-H190DL (#[id number]) Colonoscope was  ?                          introduced through the anus and advanced to the 2  ?                          cm into the ileum.  The colonoscopy was performed  ?                          without difficulty. The patient tolerated the  ?                          procedure well. The quality of the bowel  ?                          preparation was good. The terminal ileum, ileocecal  ?                          valve, appendiceal orifice, and rectum were  ?                          photographed. ?Scope In: 9:28:31 AM ?Scope Out: 9:42:22 AM ?Scope Withdrawal Time: 0 hours 10 minutes 12 seconds  ?Total Procedure Duration: 0 hours 13 minutes 51 seconds  ?Findings:                 A few (8-10) localized non-bleeding erosions with 2  ?                          superficial ulcers were found in the proximal  ?                          ascending colon and in the cecum. No stigmata of  ?                          recent bleeding were seen. Biopsies were taken with  ?                          a cold forceps for histology. ?                          A few small-mouthed diverticula were found in the  ?                          sigmoid colon and transverse colon. ?  Non-bleeding internal hemorrhoids were found during  ?                          retroflexion. The hemorrhoids were moderate and  ?                          Grade I (internal hemorrhoids that do not prolapse). ?                          The terminal ileum appeared normal. ?                          The exam was otherwise without abnormality on  ?                          direct and retroflexion views. ?Complications:            No immediate complications. ?Estimated Blood Loss:     Estimated blood loss: none. ?Impression:               - A few erosions in the proximal ascending colon  ?                          and in the cecum. Biopsied. Normal terminal ileum.  ?                          (d/d nonsteroidal, prep or early Crohn's) ?                          - Mild colonic diverticulosis. ?                          - Non-bleeding internal hemorrhoids. ?                           - The examined portion of the ileum was normal. ?                          - The examination was otherwise normal on direct  ?                          and retroflexion views. ?Recommendation:           - Patient has a contact number available for  ?                          emergencies. The signs and symptoms of potential  ?                          delayed complications were discussed with the  ?                          patient. Return to normal activities tomorrow.  ?                          Written discharge instructions were provided to the  ?  patient. ?                          - Resume previous diet. ?                          - Continue present medications. ?                          - Avoid ibuprofen, naproxen, or other non-steroidal  ?                          anti-inflammatory drugs. ?                          - Return to GI clinic in 12 weeks. ?                          - The findings and recommendations were discussed  ?                          with the patient's family. ?Jackquline Denmark, MD ?07/18/2021 9:48:33 AM ?This report has been signed electronically. ?

## 2021-07-20 ENCOUNTER — Telehealth: Payer: Self-pay | Admitting: *Deleted

## 2021-07-20 NOTE — Telephone Encounter (Signed)
?  Follow up Call- ? ?Call back number 07/18/2021  ?Post procedure Call Back phone  # [phone] cell  ?Permission to leave phone message Yes  ?Some recent data might be hidden  ?  ? ?Patient questions: ? ?Do you have a fever, pain , or abdominal swelling? No. ?Pain Score  0 * ? ?Have you tolerated food without any problems? Yes.   ? ?Have you been able to return to your normal activities? Yes.   ? ?Do you have any questions about your discharge instructions: ?Diet   No. ?Medications  No. ?Follow up visit  No. ? ?Do you have questions or concerns about your Care? No. ? ?Actions: ?* If pain score is 4 or above: ?No action needed, pain <4. ? ? ?

## 2021-07-22 ENCOUNTER — Encounter: Payer: Self-pay | Admitting: Gastroenterology

## 2021-07-23 ENCOUNTER — Other Ambulatory Visit: Payer: Self-pay

## 2021-07-23 DIAGNOSIS — K529 Noninfective gastroenteritis and colitis, unspecified: Secondary | ICD-10-CM

## 2021-08-01 ENCOUNTER — Other Ambulatory Visit (INDEPENDENT_AMBULATORY_CARE_PROVIDER_SITE_OTHER): Payer: PRIVATE HEALTH INSURANCE

## 2021-08-01 DIAGNOSIS — K529 Noninfective gastroenteritis and colitis, unspecified: Secondary | ICD-10-CM | POA: Diagnosis not present

## 2021-08-01 LAB — CBC WITH DIFFERENTIAL/PLATELET
Basophils Absolute: 0 10*3/uL (ref 0.0–0.1)
Basophils Relative: 0.7 % (ref 0.0–3.0)
Eosinophils Absolute: 0.1 10*3/uL (ref 0.0–0.7)
Eosinophils Relative: 1.2 % (ref 0.0–5.0)
HCT: 39.3 % (ref 36.0–46.0)
Hemoglobin: 13.1 g/dL (ref 12.0–15.0)
Lymphocytes Relative: 44.1 % (ref 12.0–46.0)
Lymphs Abs: 2.1 10*3/uL (ref 0.7–4.0)
MCHC: 33.4 g/dL (ref 30.0–36.0)
MCV: 94.6 fl (ref 78.0–100.0)
Monocytes Absolute: 0.3 10*3/uL (ref 0.1–1.0)
Monocytes Relative: 5.7 % (ref 3.0–12.0)
Neutro Abs: 2.3 10*3/uL (ref 1.4–7.7)
Neutrophils Relative %: 48.3 % (ref 43.0–77.0)
Platelets: 300 10*3/uL (ref 150.0–400.0)
RBC: 4.15 Mil/uL (ref 3.87–5.11)
RDW: 12.9 % (ref 11.5–15.5)
WBC: 4.7 10*3/uL (ref 4.0–10.5)

## 2021-08-01 LAB — COMPREHENSIVE METABOLIC PANEL
ALT: 12 U/L (ref 0–35)
AST: 18 U/L (ref 0–37)
Albumin: 3.9 g/dL (ref 3.5–5.2)
Alkaline Phosphatase: 71 U/L (ref 39–117)
BUN: 13 mg/dL (ref 6–23)
CO2: 28 mEq/L (ref 19–32)
Calcium: 9.3 mg/dL (ref 8.4–10.5)
Chloride: 102 mEq/L (ref 96–112)
Creatinine, Ser: 1.1 mg/dL (ref 0.40–1.20)
GFR: 54.24 mL/min — ABNORMAL LOW (ref >60.00)
Glucose, Bld: 85 mg/dL (ref 70–99)
Potassium: 4 mEq/L (ref 3.5–5.1)
Sodium: 136 mEq/L (ref 135–145)
Total Bilirubin: 0.4 mg/dL (ref 0.2–1.2)
Total Protein: 7.5 g/dL (ref 6.0–8.3)

## 2021-08-01 LAB — C-REACTIVE PROTEIN: CRP: <1 mg/dL (ref 0.5–20.0)

## 2021-08-13 ENCOUNTER — Encounter: Payer: Self-pay | Admitting: Gastroenterology

## 2021-08-13 ENCOUNTER — Ambulatory Visit (INDEPENDENT_AMBULATORY_CARE_PROVIDER_SITE_OTHER): Payer: PRIVATE HEALTH INSURANCE | Admitting: Gastroenterology

## 2021-08-13 VITALS — BP 150/90 | HR 85 | Ht 63.0 in | Wt 166.4 lb

## 2021-08-13 DIAGNOSIS — K581 Irritable bowel syndrome with constipation: Secondary | ICD-10-CM

## 2021-08-13 NOTE — Progress Notes (Signed)
? ? ?Chief Complaint: FU ? ?Referring Provider:  Bonnita Nasuti, MD    ? ? ?ASSESSMENT AND PLAN;  ? ?#1.  Right-sided erosions with normal TI-most likely due to Suprep.  No clinical evidence of Crohn's.  Normal CRP and CBC.  ? ? ?Plan: ?-Discussed with the patient and patient's husband in detail.  I have given him various options including watchful waiting, CT Abdo/pelvis, empiric treatment with mesalamine or Biologics.  I have discussed risks and benefits of each option.  They have chosen watchful waiting for now.  If she starts having any symptoms, she would promptly get in touch with [place]. ?-Recall colon in 3 yrs with miralax ? ?HPI:   ? ?Tiffany Perkins is a 61 y.o. female  ? ?Follow-up after colonoscopy, accompanied by her husband ? ?Colonoscopy 07/18/2021 ?- A few erosions in the proximal ascending colon and in the cecum. Biopsied. Normal terminal ?ileum. (d/d nonsteroidal, prep or early Crohn's). Bx- SEVERELY CHRONIC ACTIVE COLITIS ?- Mild colonic diverticulosis. ?- Non-bleeding internal hemorrhoids. ?- The examined portion of the ileum was normal. ?- The examination was otherwise normal on direct and retroflexion views. ? ?Nl CBC, CMP, CRP 08/01/2021 ? ?Having constipation rather than diarrhea. ? ?No problems ? ?Would like to hold off on any medications.  She is avoiding nonsteroidals. ? ?SH-she is a close friend of Dr. Jannette Fogo.  She follows with him on regular basis and has been doing so over last several years. ?Past Medical History:  ?Diagnosis Date  ? Breast cancer ([facility])   ? Hypertension   ? ? ?Past Surgical History:  ?Procedure Laterality Date  ? BREAST LUMPECTOMY Left 2005  ? COLONOSCOPY  03/27/2016  ? Colonic polyp status post polypectomy. Moderate melanosis coli. Mild sigmoid divertuculosis.  ? TUBAL LIGATION    ? ? ?Family History  ?Problem Relation Age of Onset  ? Diabetes Mother   ? Heart Problems Mother   ? Diabetes Sister   ? Diabetes Sister   ? Diabetes Brother   ? Diabetes Brother   ? Diabetes  Brother   ? Stroke Brother   ? Esophageal cancer Other   ? Stomach cancer Other   ? Rectal cancer Other   ? Colon cancer Other   ? Colon polyps Other   ? ? ?Social History  ? ?Tobacco Use  ? Smoking status: Never  ? Smokeless tobacco: Never  ?Vaping Use  ? Vaping Use: Never used  ?Substance Use Topics  ? Alcohol use: Yes  ?  Alcohol/week: 1.0 standard drink  ?  Types: 1 Glasses of wine per week  ?  Comment: Occasional glass of wine  ? Drug use: No  ? ? ?Current Outpatient Medications  ?Medication Sig Dispense Refill  ? amLODipine (NORVASC) 10 MG tablet Take 10 mg by mouth daily.    ? Gelatin-Mineral Combinations (NAIL STRENGTHENER/GELATIN) CAPS Take by mouth.    ? ?No current facility-administered medications for this visit.  ? ? ?Allergies  ?Allergen Reactions  ? Hydrocodone-Acetaminophen Nausea Only  ? Oxycodone-Acetaminophen Nausea And Vomiting  ? Penicillins   ?  Per pt "out of body feeling".  ? ? ?Review of Systems:  ?neg ? ?  ? ?Physical Exam:   ? ?BP (!) 150/90   Pulse 85   Ht '5\' 3"'$  (1.6 m)   Wt 166 lb 6 oz (75.5 kg)   SpO2 99%   BMI 29.47 kg/m?  ?Wt Readings from Last 3 Encounters:  ?08/13/21 166 lb 6 oz (75.5 kg)  ?  07/18/21 166 lb (75.3 kg)  ?05/18/21 166 lb (75.3 kg)  ? ?Constitutional:  Well-developed, in no acute distress. ?Abdominal: Soft, nondistended. Nontender. Bowel sounds active throughout. There are no masses palpable. No hepatomegaly. ?Rectal: Deferred ?Neurological: Alert and oriented to person place and time. ?Skin: Skin is warm and dry. No rashes noted. ? ?Data Reviewed: I have personally reviewed following labs and imaging studies ? ?CBC: ? ?  Latest Ref Rng & Units 08/01/2021  ?  1:04 PM 11/08/2011  ? 11:47 AM 10/12/2010  ? 11:05 AM  ?CBC  ?WBC 4.0 - 10.5 K/uL 4.7   3.9   3.6    ?Hemoglobin 12.0 - 15.0 g/dL 13.1   12.6   13.4    ?Hematocrit 36.0 - 46.0 % 39.3   37.6   38.8    ?Platelets 150.0 - 400.0 K/uL 300.0   244   247    ? ? ?CMP: ? ?  Latest Ref Rng & Units 08/01/2021  ?  1:04 PM  11/08/2011  ? 11:47 AM 10/12/2010  ? 11:05 AM  ?CMP  ?Glucose 70 - 99 mg/dL 85   80   92    ?BUN 6 - 23 mg/dL '13   22   18    '$ ?Creatinine 0.40 - 1.20 mg/dL 1.10   1.25   1.23    ?Sodium 135 - 145 mEq/L 136   138   140    ?Potassium 3.5 - 5.1 mEq/L 4.0   4.1   4.2    ?Chloride 96 - 112 mEq/L 102   104   105    ?CO2 19 - 32 mEq/L '28   25   26    '$ ?Calcium 8.4 - 10.5 mg/dL 9.3   9.3   9.4    ?Total Protein 6.0 - 8.3 g/dL 7.5   7.3   7.6    ?Total Bilirubin 0.2 - 1.2 mg/dL 0.4   0.3   0.3    ?Alkaline Phos 39 - 117 U/L 71   61   68    ?AST 0 - 37 U/L '18   20   20    '$ ?ALT 0 - 35 U/L '12   14   16    '$ ? ?No charge since it is a follow-up post colonoscopy visit. ? ? ?Carmell Austria, MD 08/13/2021, 2:27 PM ? ?Cc: Hague, Rosalyn Charters, MD ? ? ?

## 2021-08-13 NOTE — Patient Instructions (Signed)
If you are age 62 or older, your body mass index should be between 23-30. Your Body mass index is 29.47 kg/m?Marland Kitchen If this is out of the aforementioned range listed, please consider follow up with your Primary Care Provider. ? ?If you are age 66 or younger, your body mass index should be between 19-25. Your Body mass index is 29.47 kg/m?Marland Kitchen If this is out of the aformentioned range listed, please consider follow up with your Primary Care Provider.  ? ?________________________________________________________ ? ?The [facility] providers would like to encourage you to use [facility] to communicate with providers for non-urgent requests or questions.  Due to long hold times on the telephone, sending your provider a message by [facility] may be a faster and more efficient way to get a response.  Please allow 48 business hours for a response.  Please remember that this is for non-urgent requests.  ?_______________________________________________________ ? ?Repeat colonoscopy due 08-2024. Please call 2 months before to schedule this. ? ?Call with any questions or concerns. ? ?Thank you, ? ?Dr. Jackquline Denmark ? ? ? ? ? ?We want to thank you for trusting [facility] with your care. All of our staff and providers value the relationships we have built with our patients, and it is an honor to care for you.  ? ?We are writing to let you know that [facility] will close on Sep 24, 2021, and we invite you to continue to see Dr. Carmell Austria and Gerrit Heck at the [facility] office location. We are consolidating our serices at these [facility] practices to better provide care. Our office staff will work with you to ensure a seamless transition.  ? ?Gerrit Heck, DO -Dr. Bryan Lemma will be movig to [facility] at [address] [place], [place] [address], effective Sep 24, 2021.  Contact [phone] to schedule an appointment with him.  ? ?Carmell Austria, MD- Dr.  Lyndel Safe will be movig to [facility] at [address], [place], [place] [address], effective Sep 24, 2021.  Contact [phone] to schedule an appointment with him.  ? ?Requesting Medical Records ?If you need to request your medical records, please follow the instructions below. Your medical records are confidential, and a copy can be transferred to another provider or released to you or another person you designate only with your permission. ? ?There are several ways to request your medical records: ?Requests for medical records can be submitted through our practice.   ?You can also request your records electronically, in your MyChart account by selecting the ?Request Health Records? tab.  ?If you need additional information on how to request records, please go to [email or web address] choose Patient Information, then select Request Medical Records. ?To make an appointment or if you have any questions about your health care needs, please contact our office at [phone] and one of our staff members will be glad to assist you. ?[facility] is committed to providing exceptional care for you and our community. Thank you for allowing [place] to serve your health care needs. ?Sincerely, ? ?Windy Canny, Director [facility] ?[facility] also offers convenient virtual care options. Sore throat? Sinus problems? Cold or flu symptoms? Get care from the comfort of home with [facility] Video Visits and e-Visits. Learn more about the non-emergency conditions treated and start your virtual visit at [email or web address] ? ?

## 2022-02-25 ENCOUNTER — Other Ambulatory Visit: Payer: Self-pay | Admitting: Internal Medicine

## 2022-02-25 DIAGNOSIS — Z1231 Encounter for screening mammogram for malignant neoplasm of breast: Secondary | ICD-10-CM

## 2022-04-09 ENCOUNTER — Ambulatory Visit: Payer: PRIVATE HEALTH INSURANCE

## 2022-05-22 ENCOUNTER — Ambulatory Visit
Admission: RE | Admit: 2022-05-22 | Discharge: 2022-05-22 | Disposition: A | Discharge status: Home / Self Care | Payer: No Typology Code available for payment source | Source: Ambulatory Visit | Attending: Internal Medicine | Admitting: Internal Medicine

## 2022-05-22 DIAGNOSIS — Z1231 Encounter for screening mammogram for malignant neoplasm of breast: Secondary | ICD-10-CM

## 2022-08-21 ENCOUNTER — Other Ambulatory Visit: Payer: Self-pay | Admitting: Family

## 2022-08-21 DIAGNOSIS — R519 Headache, unspecified: Secondary | ICD-10-CM

## 2022-08-30 ENCOUNTER — Other Ambulatory Visit: Payer: No Typology Code available for payment source

## 2022-09-02 ENCOUNTER — Ambulatory Visit
Admission: RE | Admit: 2022-09-02 | Discharge: 2022-09-02 | Disposition: A | Discharge status: Home / Self Care | Payer: No Typology Code available for payment source | Source: Ambulatory Visit | Attending: Family

## 2022-09-02 DIAGNOSIS — R519 Headache, unspecified: Secondary | ICD-10-CM

## 2022-09-23 ENCOUNTER — Other Ambulatory Visit: Payer: No Typology Code available for payment source

## 2023-03-22 ENCOUNTER — Emergency Department (HOSPITAL_COMMUNITY)
Admission: EM | Admit: 2023-03-22 | Discharge: 2023-03-23 | Disposition: A | Discharge status: Home / Self Care | Payer: Commercial Managed Care - HMO | Attending: Emergency Medicine | Admitting: Emergency Medicine

## 2023-03-22 ENCOUNTER — Encounter (HOSPITAL_COMMUNITY): Payer: Self-pay

## 2023-03-22 ENCOUNTER — Other Ambulatory Visit: Payer: Self-pay

## 2023-03-22 DIAGNOSIS — I10 Essential (primary) hypertension: Secondary | ICD-10-CM | POA: Diagnosis not present

## 2023-03-22 DIAGNOSIS — Z853 Personal history of malignant neoplasm of breast: Secondary | ICD-10-CM | POA: Insufficient documentation

## 2023-03-22 DIAGNOSIS — K529 Noninfective gastroenteritis and colitis, unspecified: Secondary | ICD-10-CM | POA: Insufficient documentation

## 2023-03-22 DIAGNOSIS — Z79899 Other long term (current) drug therapy: Secondary | ICD-10-CM | POA: Diagnosis not present

## 2023-03-22 DIAGNOSIS — R1011 Right upper quadrant pain: Secondary | ICD-10-CM | POA: Diagnosis present

## 2023-03-22 LAB — CBC WITH DIFFERENTIAL/PLATELET
Abs Immature Granulocytes: 0.04 10*3/uL (ref 0.00–0.07)
Basophils Absolute: 0 10*3/uL (ref 0.0–0.1)
Basophils Relative: 0 %
Eosinophils Absolute: 0.1 10*3/uL (ref 0.0–0.5)
Eosinophils Relative: 1 %
HCT: 37.3 % (ref 36.0–46.0)
Hemoglobin: 12.3 g/dL (ref 12.0–15.0)
Immature Granulocytes: 0 %
Lymphocytes Relative: 32 %
Lymphs Abs: 3.1 10*3/uL (ref 0.7–4.0)
MCH: 31.5 pg (ref 26.0–34.0)
MCHC: 33 g/dL (ref 30.0–36.0)
MCV: 95.4 fL (ref 80.0–100.0)
Monocytes Absolute: 0.4 10*3/uL (ref 0.1–1.0)
Monocytes Relative: 4 %
Neutro Abs: 6.1 10*3/uL (ref 1.7–7.7)
Neutrophils Relative %: 63 %
Platelets: 305 10*3/uL (ref 150–400)
RBC: 3.91 MIL/uL (ref 3.87–5.11)
RDW: 12.1 % (ref 11.5–15.5)
WBC: 9.7 10*3/uL (ref 4.0–10.5)
nRBC: 0 % (ref 0.0–0.2)

## 2023-03-22 LAB — COMPREHENSIVE METABOLIC PANEL
ALT: 14 U/L (ref 0–44)
AST: 20 U/L (ref 15–41)
Albumin: 3.4 g/dL — ABNORMAL LOW (ref 3.5–5.0)
Alkaline Phosphatase: 60 U/L (ref 38–126)
Anion gap: 11 (ref 5–15)
BUN: 16 mg/dL (ref 8–23)
CO2: 26 mmol/L (ref 22–32)
Calcium: 9.1 mg/dL (ref 8.9–10.3)
Chloride: 101 mmol/L (ref 98–111)
Creatinine, Ser: 1.37 mg/dL — ABNORMAL HIGH (ref 0.44–1.00)
GFR, Estimated: 43 mL/min — ABNORMAL LOW (ref >60)
Glucose, Bld: 130 mg/dL — ABNORMAL HIGH (ref 70–99)
Potassium: 3 mmol/L — ABNORMAL LOW (ref 3.5–5.1)
Sodium: 138 mmol/L (ref 135–145)
Total Bilirubin: 0.5 mg/dL (ref <1.2)
Total Protein: 7.2 g/dL (ref 6.5–8.1)

## 2023-03-22 LAB — URINALYSIS, W/ REFLEX TO CULTURE (INFECTION SUSPECTED)
Bilirubin Urine: NEGATIVE
Glucose, UA: NEGATIVE mg/dL
Ketones, ur: NEGATIVE mg/dL
Leukocytes,Ua: NEGATIVE
Nitrite: NEGATIVE
Protein, ur: NEGATIVE mg/dL
Specific Gravity, Urine: 1.017 (ref 1.005–1.030)
pH: 6 (ref 5.0–8.0)

## 2023-03-22 LAB — LIPASE, BLOOD: Lipase: 54 U/L — ABNORMAL HIGH (ref 11–51)

## 2023-03-22 MED ORDER — LACTATED RINGERS IV BOLUS
1000.0000 mL | Freq: Once | INTRAVENOUS | Status: AC
  Administered 2023-03-23: 1000 mL via INTRAVENOUS

## 2023-03-22 MED ORDER — METOCLOPRAMIDE HCL 5 MG/ML IJ SOLN
10.0000 mg | Freq: Once | INTRAMUSCULAR | Status: AC
  Administered 2023-03-22: 10 mg via INTRAVENOUS
  Filled 2023-03-22: qty 2

## 2023-03-22 MED ORDER — ONDANSETRON 4 MG PO TBDP
4.0000 mg | ORAL_TABLET | Freq: Once | ORAL | Status: AC
  Administered 2023-03-22: 4 mg via ORAL

## 2023-03-22 MED ORDER — MORPHINE SULFATE (PF) 2 MG/ML IV SOLN
4.0000 mg | Freq: Once | INTRAVENOUS | Status: AC
  Administered 2023-03-22: 4 mg via INTRAVENOUS
  Filled 2023-03-22: qty 2

## 2023-03-22 NOTE — ED Triage Notes (Signed)
  Pt began having ABD pain NVD a couple of hours ago. Denies eating anything different.  Pain is in RUQ and LUQ.

## 2023-03-23 ENCOUNTER — Emergency Department (HOSPITAL_COMMUNITY): Payer: Commercial Managed Care - HMO

## 2023-03-23 MED ORDER — MORPHINE SULFATE (PF) 4 MG/ML IV SOLN
4.0000 mg | Freq: Once | INTRAVENOUS | Status: AC
  Administered 2023-03-23: 4 mg via INTRAVENOUS
  Filled 2023-03-23: qty 1

## 2023-03-23 MED ORDER — ONDANSETRON HCL 4 MG/2ML IJ SOLN
4.0000 mg | Freq: Once | INTRAMUSCULAR | Status: AC
  Administered 2023-03-23: 4 mg via INTRAVENOUS
  Filled 2023-03-23: qty 2

## 2023-03-23 MED ORDER — ONDANSETRON 4 MG PO TBDP: 4.0000 mg | ORAL_TABLET | Freq: Three times a day (TID) | ORAL | 0 refills | Status: AC | PRN

## 2023-03-23 MED ORDER — IOHEXOL 350 MG/ML SOLN
60.0000 mL | Freq: Once | INTRAVENOUS | Status: AC | PRN
  Administered 2023-03-23: 60 mL via INTRAVENOUS

## 2023-03-23 NOTE — ED Provider Notes (Signed)
[place] EMERGENCY DEPARTMENT AT [facility] Provider Note   CSN: [id number] Arrival date & time: 03/22/23  2048     History  Chief Complaint  Patient presents with   Abdominal Pain    Tiffany Perkins is a 62 y.o. female.  HPI     This is a 62 year old female who presents with abdominal pain, nausea, vomiting, diarrhea.  Patient reports onset of symptoms tonight.  She went out to eat and had fish and shrimp for dinner.  She came home and began to feel ill.  She has had multiple episodes of nonbilious, nonbloody emesis and diarrhea.  She reports crampy and sharp abdominal pain.  She was given some morphine with minimal relief.  No fevers.  Denies urinary symptoms.  Home Medications Prior to Admission medications   Medication Sig Start Date End Date Taking? Authorizing Provider  ondansetron (ZOFRAN-ODT) 4 MG disintegrating tablet Take 1 tablet (4 mg total) by mouth every 8 (eight) hours as needed. 03/23/23  Yes Atharv Barriere, Mayer Masker, MD  amLODipine (NORVASC) 10 MG tablet Take 10 mg by mouth daily. 01/22/21   [provider]  Gelatin-Mineral Combinations (NAIL STRENGTHENER/GELATIN) CAPS Take by mouth.    [provider]      Allergies    Hydrocodone-acetaminophen, Oxycodone-acetaminophen, and Penicillins    Review of Systems   Review of Systems  Constitutional:  Negative for fever.  Gastrointestinal:  Positive for abdominal pain, diarrhea, nausea and vomiting.  All other systems reviewed and are negative.   Physical Exam Updated Vital Signs BP 134/78   Pulse (!) 103   Temp (!) 97 F (36.1 C)   Resp 16   Ht 1.6 m (5\' 3" )   Wt 75.5 kg   SpO2 100%   BMI 29.48 kg/m  Physical Exam Vitals and nursing note reviewed.  Constitutional:      Appearance: She is well-developed. She is not ill-appearing.  HENT:     Head: Normocephalic and atraumatic.  Eyes:     Pupils: Pupils are equal, round, and reactive to light.  Cardiovascular:     Rate  and Rhythm: Normal rate and regular rhythm.     Heart sounds: Normal heart sounds.  Pulmonary:     Effort: Pulmonary effort is normal. No respiratory distress.     Breath sounds: No wheezing.  Abdominal:     General: Bowel sounds are normal.     Palpations: Abdomen is soft.     Tenderness: There is abdominal tenderness in the right upper quadrant and epigastric area. There is no guarding or rebound.  Musculoskeletal:     Cervical back: Neck supple.  Skin:    General: Skin is warm and dry.  Neurological:     Mental Status: She is alert and oriented to person, place, and time.  Psychiatric:        Mood and Affect: Mood normal.     ED Results / Procedures / Treatments   Labs (all labs ordered are listed, but only abnormal results are displayed) Labs Reviewed  COMPREHENSIVE METABOLIC PANEL - Abnormal; Notable for the following components:      Result Value   Potassium 3.0 (*)    Glucose, Bld 130 (*)    Creatinine, Ser 1.37 (*)    Albumin 3.4 (*)    GFR, Estimated 43 (*)    All other components within normal limits  LIPASE, BLOOD - Abnormal; Notable for the following components:   Lipase 54 (*)    All  other components within normal limits  URINALYSIS, W/ REFLEX TO CULTURE (INFECTION SUSPECTED) - Abnormal; Notable for the following components:   Hgb urine dipstick MODERATE (*)    Bacteria, UA RARE (*)    All other components within normal limits  CBC WITH DIFFERENTIAL/PLATELET    EKG None  Radiology CT ABDOMEN PELVIS W CONTRAST  Result Date: 03/23/2023 CLINICAL DATA:  Abdominal pain, nausea, vomiting, diarrhea EXAM: CT ABDOMEN AND PELVIS WITH CONTRAST TECHNIQUE: Multidetector CT imaging of the abdomen and pelvis was performed using the standard protocol following bolus administration of intravenous contrast. RADIATION DOSE REDUCTION: This exam was performed according to the departmental dose-optimization program which includes automated exposure control, adjustment of the  mA and/or kV according to patient size and/or use of iterative reconstruction technique. CONTRAST:  60mL OMNIPAQUE IOHEXOL 350 MG/ML SOLN COMPARISON:  CT abdomen and pelvis 05/25/2007 FINDINGS: Lower chest: No acute abnormality. Hepatobiliary: Mild distention of the gallbladder. No radiopaque stone or biliary dilation. Pancreas: Unremarkable liver. Spleen: 1.1 cm cystic lesion in the pancreatic head (series 3/image 28). No ductal dilation. No acute inflammatory change. Adrenals/Urinary Tract: Unremarkable adrenal glands. No urinary calculi or hydronephrosis. Unremarkable bladder. Stomach/Bowel: No bowel obstruction. Question mild wall thickening of the transverse colon versus underdistention. There may be some subtle fat stranding about the transverse colon. Appendix and stomach are within normal limits. Vascular/Lymphatic: No significant vascular findings are present. No enlarged abdominal or pelvic lymph nodes. Reproductive: Uterus and bilateral adnexa are unremarkable. Other: No free intraperitoneal fluid or air. Musculoskeletal: No acute fracture. Grade one anterolisthesis of L4. IMPRESSION: 1. Question mild transverse colitis. Otherwise no acute abnormality in the abdomen or pelvis. 2. 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 Imaging follow-up with contrast-enhanced MRI or pancreas protocol CT in 1 year is recommended. Electronically Signed   By: Minerva Fester M.D.   On: 03/23/2023 02:17    Procedures Procedures    Medications Ordered in ED Medications  ondansetron (ZOFRAN-ODT) disintegrating tablet 4 mg (4 mg Oral Given 03/22/23 2105)  morphine (PF) 2 MG/ML injection 4 mg (4 mg Intravenous Given 03/22/23 2210)  metoCLOPramide (REGLAN) injection 10 mg (10 mg Intravenous Given 03/22/23 2210)  lactated ringers bolus 1,000 mL (1,000 mLs Intravenous New Bag/Given 03/23/23 0047)  morphine (PF) 4 MG/ML injection 4 mg (4 mg Intravenous Given 03/23/23 0119)  ondansetron  (ZOFRAN) injection 4 mg (4 mg Intravenous Given 03/23/23 0117)  iohexol (OMNIPAQUE) 350 MG/ML injection 60 mL (60 mLs Intravenous Contrast Given 03/23/23 0143)    ED Course/ Medical Decision Making/ A&P                                 Medical Decision Making Amount and/or Complexity of Data Reviewed Labs: ordered. Radiology: ordered.  Risk Prescription drug management.   This patient presents to the ED for concern of abdominal pain, nausea, vomiting, this involves an extensive number of treatment options, and is a complaint that carries with it a high risk of complications and morbidity.  I considered the following differential and admission for this acute, potentially life threatening condition.  The differential diagnosis includes gastritis, gastroenteritis, appendicitis, cholecystitis, pancreatitis  MDM:    This is a 62 year old female who presents with nausea, vomiting, diarrhea, abdominal pain.  She appears uncomfortable on exam.  She has balled up in a ball.  Mildly tachycardic but otherwise vital signs are reassuring.  She is  afebrile.  She is diffusely tender in the abdomen.  Patient was given pain and nausea medication.  Basic lab work obtained.  This is largely reassuring.  Given the discomfort of the patient, CT imaging was obtained.  No evidence of significant intra-abdominal pathology.  Questionable colitis.  Clinically likely enteritis.  Patient was ultimately able to orally hydrate.  She does incidentally have a pancreatic cyst which will need follow-up imaging.  Will discharge with Zofran.  (Labs, imaging, consults)  Labs: I Ordered, and personally interpreted labs.  The pertinent results include: CBC, CMP, urinalysis, lipase  Imaging Studies ordered: I ordered imaging studies including CT I independently visualized and interpreted imaging. I agree with the radiologist interpretation  Additional history obtained from chart review.  External records from outside source  obtained and reviewed including prior evaluations  Cardiac Monitoring: The patient was maintained on a cardiac monitor.  If on the cardiac monitor, I personally viewed and interpreted the cardiac monitored which showed an underlying rhythm of: Sinus rhythm  Reevaluation: After the interventions noted above, I reevaluated the patient and found that they have :improved  Social Determinants of Health:  lives independently  Disposition: Discharge  Co morbidities that complicate the patient evaluation  Past Medical History:  Diagnosis Date   Breast cancer ([facility])    Hypertension      Medicines Meds ordered this encounter  Medications   ondansetron (ZOFRAN-ODT) disintegrating tablet 4 mg   morphine (PF) 2 MG/ML injection 4 mg   metoCLOPramide (REGLAN) injection 10 mg   lactated ringers bolus 1,000 mL   morphine (PF) 4 MG/ML injection 4 mg   ondansetron (ZOFRAN) injection 4 mg   iohexol (OMNIPAQUE) 350 MG/ML injection 60 mL   ondansetron (ZOFRAN-ODT) 4 MG disintegrating tablet    Sig: Take 1 tablet (4 mg total) by mouth every 8 (eight) hours as needed.    Dispense:  20 tablet    Refill:  0    I have reviewed the patients home medicines and have made adjustments as needed  Problem List / ED Course: Problem List Items Addressed This Visit   None Visit Diagnoses     Gastroenteritis    -  Primary                   Final Clinical Impression(s) / ED Diagnoses Final diagnoses:  Gastroenteritis    Rx / DC Orders ED Discharge Orders          Ordered    ondansetron (ZOFRAN-ODT) 4 MG disintegrating tablet  Every 8 hours PRN        03/23/23 0314              Shon Baton, MD 03/23/23 [phone]

## 2023-03-23 NOTE — ED Notes (Signed)
PT to CT.

## 2023-03-23 NOTE — ED Notes (Signed)
Pt ambulatory to restroom; provided gatorade for PO hydration

## 2023-03-23 NOTE — Discharge Instructions (Addendum)
You were seen today for nausea, vomiting, abdominal pain, diarrhea.  It appears that you have some colitis versus gastritis.  This is likely viral in nature.  Make sure that you are staying hydrated.  You will be given Zofran at home.  Of note, you do have a small cyst on your pancreas that needs repeat CT imaging in 1 year.

## 2023-03-23 NOTE — ED Notes (Signed)
Pt tolerated Gatorade

## 2023-04-25 ENCOUNTER — Other Ambulatory Visit: Payer: Self-pay | Admitting: Internal Medicine

## 2023-04-25 DIAGNOSIS — Z1231 Encounter for screening mammogram for malignant neoplasm of breast: Secondary | ICD-10-CM

## 2023-05-27 ENCOUNTER — Ambulatory Visit: Payer: No Typology Code available for payment source

## 2023-05-30 ENCOUNTER — Ambulatory Visit
Admission: RE | Admit: 2023-05-30 | Discharge: 2023-05-30 | Disposition: A | Discharge status: Home / Self Care | Payer: Commercial Managed Care - HMO | Source: Ambulatory Visit | Attending: Internal Medicine | Admitting: Internal Medicine

## 2023-05-30 DIAGNOSIS — Z1231 Encounter for screening mammogram for malignant neoplasm of breast: Secondary | ICD-10-CM

## 2023-05-30 HISTORY — DX: Personal history of irradiation: Z92.3

## 2023-05-30 HISTORY — DX: Personal history of antineoplastic chemotherapy: Z92.21

## 2024-05-19 ENCOUNTER — Other Ambulatory Visit: Payer: Self-pay | Admitting: Internal Medicine

## 2024-05-19 DIAGNOSIS — Z1231 Encounter for screening mammogram for malignant neoplasm of breast: Secondary | ICD-10-CM

## 2024-06-02 ENCOUNTER — Ambulatory Visit
Admission: RE | Admit: 2024-06-02 | Discharge: 2024-06-02 | Disposition: A | Source: Ambulatory Visit | Attending: Internal Medicine | Admitting: Internal Medicine

## 2024-06-02 DIAGNOSIS — Z1231 Encounter for screening mammogram for malignant neoplasm of breast: Secondary | ICD-10-CM
